# Patient Record
Sex: Female | Born: 1955 | Race: White | Hispanic: No | Smoking: Never smoker
Health system: Southern US, Community
[De-identification: ages and names within clinical notes are randomized; demographics above are authoritative.]

## PROBLEM LIST (undated history)

## (undated) DIAGNOSIS — I10 Essential (primary) hypertension: Secondary | ICD-10-CM

## (undated) DIAGNOSIS — M1990 Unspecified osteoarthritis, unspecified site: Secondary | ICD-10-CM

## (undated) DIAGNOSIS — I7300 Raynaud's syndrome without gangrene: Secondary | ICD-10-CM

## (undated) DIAGNOSIS — K219 Gastro-esophageal reflux disease without esophagitis: Secondary | ICD-10-CM

## (undated) DIAGNOSIS — E079 Disorder of thyroid, unspecified: Secondary | ICD-10-CM

## (undated) DIAGNOSIS — D649 Anemia, unspecified: Secondary | ICD-10-CM

## (undated) DIAGNOSIS — Z5189 Encounter for other specified aftercare: Secondary | ICD-10-CM

## (undated) HISTORY — DX: Raynaud's syndrome without gangrene: I73.00

## (undated) HISTORY — DX: Gastro-esophageal reflux disease without esophagitis: K21.9

## (undated) HISTORY — DX: Anemia, unspecified: D64.9

## (undated) HISTORY — DX: Unspecified osteoarthritis, unspecified site: M19.90

## (undated) HISTORY — PX: WISDOM TOOTH EXTRACTION: SHX21

## (undated) HISTORY — DX: Disorder of thyroid, unspecified: E07.9

## (undated) HISTORY — DX: Essential (primary) hypertension: I10

## (undated) HISTORY — PX: TONSILLECTOMY: SUR1361

## (undated) HISTORY — DX: Encounter for other specified aftercare: Z51.89

---

## 2013-07-10 ENCOUNTER — Encounter (HOSPITAL_COMMUNITY): Payer: Self-pay | Admitting: Emergency Medicine

## 2013-07-10 ENCOUNTER — Emergency Department (HOSPITAL_COMMUNITY)
Admission: EM | Admit: 2013-07-10 | Discharge: 2013-07-10 | Disposition: A | Discharge status: Home / Self Care | Payer: BC Managed Care – PPO | Source: Home / Self Care | Attending: Family Medicine | Admitting: Family Medicine

## 2013-07-10 ENCOUNTER — Emergency Department (INDEPENDENT_AMBULATORY_CARE_PROVIDER_SITE_OTHER): Payer: BC Managed Care – PPO

## 2013-07-10 DIAGNOSIS — J069 Acute upper respiratory infection, unspecified: Secondary | ICD-10-CM

## 2013-07-10 MED ORDER — HYDROCOD POLST-CHLORPHEN POLST 10-8 MG/5ML PO LQCR: 5.0000 mL | Freq: Two times a day (BID) | ORAL | Status: DC

## 2013-07-10 MED ORDER — AZITHROMYCIN 250 MG PO TABS: ORAL_TABLET | ORAL | Status: DC

## 2013-07-10 MED ORDER — IPRATROPIUM BROMIDE 0.06 % NA SOLN: 2.0000 | Freq: Four times a day (QID) | NASAL | Status: DC

## 2013-07-10 NOTE — ED Notes (Signed)
C/o pneumonia [facility] and was told her they hear rales and she need a chest xray. States her sx started out as a cold but lasted for 12 days. PCP in [place].  States she does have a cough

## 2013-07-10 NOTE — ED Provider Notes (Signed)
CSN: [id number]     Arrival date & time 07/10/13  1615 History   None    Chief Complaint  Patient presents with  . Pneumonia   (Consider location/radiation/quality/duration/timing/severity/associated sxs/prior Treatment) Patient is a 57 y.o. female presenting with pneumonia. The history is provided by the patient.  Pneumonia This is a new problem. The current episode started more than 1 week ago (cough and cold for 12 days, nonsmoker, no fever.). The problem has been gradually worsening. Pertinent negatives include no chest pain, no abdominal pain and no shortness of breath.    History reviewed. No pertinent past medical history. No past surgical history on file. No family history on file. History  Substance Use Topics  . Smoking status: Not on file  . Smokeless tobacco: Not on file  . Alcohol Use: Not on file   OB History   Grav Para Term Preterm Abortions TAB SAB Ect Mult Living                 Review of Systems  Constitutional: Negative.  Negative for fever.  HENT: Negative.   Respiratory: Positive for cough. Negative for chest tightness, shortness of breath and wheezing.   Cardiovascular: Negative.  Negative for chest pain.  Gastrointestinal: Negative for abdominal pain.    Allergies  Review of patient's allergies indicates no known allergies.  Home Medications   Current Outpatient Rx  Name  Route  Sig  Dispense  Refill  . azithromycin (ZITHROMAX Z-PAK) 250 MG tablet      Take as directed on pack   6 each   0   . chlorpheniramine-HYDROcodone (TUSSIONEX PENNKINETIC ER) 10-8 MG/5ML LQCR   Oral   Take 5 mLs by mouth every 12 (twelve) hours.   115 mL   0   . ipratropium (ATROVENT) 0.06 % nasal spray   Nasal   Place 2 sprays into the nose 4 (four) times daily.   15 mL   1    BP 143/71  Pulse 100  Temp(Src) 97.9 F (36.6 C) (Oral)  Resp 18  SpO2 100% Physical Exam  Nursing note and vitals reviewed. Constitutional: She is oriented to person, place,  and time. She appears well-developed and well-nourished.  HENT:  Right Ear: External ear normal.  Left Ear: External ear normal.  Nose: Nose normal.  Mouth/Throat: Oropharynx is clear and moist.  Eyes: Conjunctivae are normal. Pupils are equal, round, and reactive to light.  Neck: Normal range of motion. Neck supple.  Cardiovascular: Regular rhythm and normal heart sounds.   Pulmonary/Chest: Effort normal and breath sounds normal. She has no wheezes. She has no rales.  Abdominal: Soft. Bowel sounds are normal.  Neurological: She is alert and oriented to person, place, and time.  Skin: Skin is warm and dry.    ED Course  Procedures (including critical care time) Labs Review Labs Reviewed - No data to display Imaging Review Dg Chest 2 View  07/10/2013   CLINICAL DATA:  Cough  EXAM: CHEST  2 VIEW  COMPARISON:  None.  FINDINGS: The heart size and mediastinal contours are within normal limits. Both lungs are clear. The visualized skeletal structures are unremarkable.  IMPRESSION: No active cardiopulmonary disease.   Electronically Signed   By: Ruel Favors M.D.   On: 07/10/2013 18:28    EKG Interpretation     Ventricular Rate:    PR Interval:    QRS Duration:   QT Interval:    QTC Calculation:   R Axis:  Text Interpretation:              MDM  X-rays reviewed and report per radiologist.     Linna Hoff, MD 07/10/13 [phone]

## 2014-07-21 ENCOUNTER — Ambulatory Visit (INDEPENDENT_AMBULATORY_CARE_PROVIDER_SITE_OTHER): Payer: BC Managed Care – PPO | Admitting: Internal Medicine

## 2014-07-21 ENCOUNTER — Encounter: Payer: Self-pay | Admitting: Internal Medicine

## 2014-07-21 ENCOUNTER — Other Ambulatory Visit (INDEPENDENT_AMBULATORY_CARE_PROVIDER_SITE_OTHER): Payer: BC Managed Care – PPO

## 2014-07-21 VITALS — BP 118/88 | HR 90 | Temp 98.3°F | Resp 18 | Ht 66.0 in | Wt 191.8 lb

## 2014-07-21 DIAGNOSIS — E669 Obesity, unspecified: Secondary | ICD-10-CM

## 2014-07-21 DIAGNOSIS — Z0000 Encounter for general adult medical examination without abnormal findings: Secondary | ICD-10-CM

## 2014-07-21 DIAGNOSIS — R790 Abnormal level of blood mineral: Secondary | ICD-10-CM

## 2014-07-21 DIAGNOSIS — E039 Hypothyroidism, unspecified: Secondary | ICD-10-CM

## 2014-07-21 LAB — TSH: TSH: 0.89 u[IU]/mL (ref 0.35–4.50)

## 2014-07-21 LAB — LIPID PANEL
CHOL/HDL RATIO: 6
Cholesterol: 212 mg/dL — ABNORMAL HIGH (ref 0–200)
HDL: 34.6 mg/dL — ABNORMAL LOW (ref >39.00)
NonHDL: 177.4
VLDL: 82 mg/dL — ABNORMAL HIGH (ref 0.0–40.0)

## 2014-07-21 LAB — BASIC METABOLIC PANEL WITH GFR
BUN: 14 mg/dL (ref 6–23)
CHLORIDE: 102 meq/L (ref 96–112)
CO2: 25 meq/L (ref 19–32)
CREATININE: 0.91 mg/dL (ref 0.50–1.10)
Calcium: 9 mg/dL (ref 8.4–10.5)
GFR, EST NON AFRICAN AMERICAN: 71 mL/min
GFR, Est African American: 82 mL/min
Glucose, Bld: 90 mg/dL (ref 70–99)
POTASSIUM: 5 meq/L (ref 3.5–5.3)
Sodium: 137 mEq/L (ref 135–145)

## 2014-07-21 LAB — HEMOGLOBIN A1C: HEMOGLOBIN A1C: 5.6 % (ref 4.6–6.5)

## 2014-07-21 LAB — T4, FREE: Free T4: 0.87 ng/dL (ref 0.60–1.60)

## 2014-07-21 MED ORDER — LISINOPRIL 5 MG PO TABS: 5.0000 mg | ORAL_TABLET | Freq: Every day | ORAL | Status: DC

## 2014-07-21 NOTE — Progress Notes (Signed)
Pre visit review using our clinic review tool, if applicable. No additional management support is needed unless otherwise documented below in the visit note. 

## 2014-07-21 NOTE — Patient Instructions (Signed)
We will check your blood work today and call you with the results. We have sent in refills for your blood pressure medication and when your blood work is back we will send in refills of your thyroid medicine.  If things are going well we can see you back in one year. If you have any new problems or questions please feel free to call our office before then.  If you continue to struggle with the Zyrtec giving a dry mouth another alternative is to use a nasal steroid such as Flonase or Nasacort. These help with the sinus congestion without giving you symptoms such as dry mouth.  Health Maintenance Adopting a healthy lifestyle and getting preventive care can go a long way to promote health and wellness. Talk with your health care provider about what schedule of regular examinations is right for you. This is a good chance for you to check in with your provider about disease prevention and staying healthy. In between checkups, there are plenty of things you can do on your own. Experts have done a lot of research about which lifestyle changes and preventive measures are most likely to keep you healthy. Ask your health care provider for more information. WEIGHT AND DIET  Eat a healthy diet  Be sure to include plenty of vegetables, fruits, low-fat dairy products, and lean protein.  Do not eat a lot of foods high in solid fats, added sugars, or salt.  Get regular exercise. This is one of the most important things you can do for your health.  Most adults should exercise for at least 150 minutes each week. The exercise should increase your heart rate and make you sweat (moderate-intensity exercise).  Most adults should also do strengthening exercises at least twice a week. This is in addition to the moderate-intensity exercise.  Maintain a healthy weight  Body mass index (BMI) is a measurement that can be used to identify possible weight problems. It estimates body fat based on height and weight. Your  health care provider can help determine your BMI and help you achieve or maintain a healthy weight.  For females 30 years of age and older:   A BMI below 18.5 is considered underweight.  A BMI of 18.5 to 24.9 is normal.  A BMI of 25 to 29.9 is considered overweight.  A BMI of 30 and above is considered obese.  Watch levels of cholesterol and blood lipids  You should start having your blood tested for lipids and cholesterol at 57 years of age, then have this test every 5 years.  You may need to have your cholesterol levels checked more often if:  Your lipid or cholesterol levels are high.  You are older than 57 years of age.  You are at high risk for heart disease.  CANCER SCREENING   Lung Cancer  Lung cancer screening is recommended for adults 43-37 years old who are at high risk for lung cancer because of a history of smoking.  A yearly low-dose CT scan of the lungs is recommended for people who:  Currently smoke.  Have quit within the past 15 years.  Have at least a 30-pack-year history of smoking. A pack year is smoking an average of one pack of cigarettes a day for 1 year.  Yearly screening should continue until it has been 15 years since you quit.  Yearly screening should stop if you develop a health problem that would prevent you from having lung cancer treatment.  Breast Cancer  Practice breast self-awareness. This means understanding how your breasts normally appear and feel.  It also means doing regular breast self-exams. Let your health care provider know about any changes, no matter how small.  If you are in your 20s or 30s, you should have a clinical breast exam (CBE) by a health care provider every 1-3 years as part of a regular health exam.  If you are 33 or older, have a CBE every year. Also consider having a breast X-ray (mammogram) every year.  If you have a family history of breast cancer, talk to your health care provider about genetic  screening.  If you are at high risk for breast cancer, talk to your health care provider about having an MRI and a mammogram every year.  Breast cancer gene (BRCA) assessment is recommended for women who have family members with BRCA-related cancers. BRCA-related cancers include:  Breast.  Ovarian.  Tubal.  Peritoneal cancers.  Results of the assessment will determine the need for genetic counseling and BRCA1 and BRCA2 testing. Cervical Cancer Routine pelvic examinations to screen for cervical cancer are no longer recommended for nonpregnant women who are considered low risk for cancer of the pelvic organs (ovaries, uterus, and vagina) and who do not have symptoms. A pelvic examination may be necessary if you have symptoms including those associated with pelvic infections. Ask your health care provider if a screening pelvic exam is right for you.   The Pap test is the screening test for cervical cancer for women who are considered at risk.  If you had a hysterectomy for a problem that was not cancer or a condition that could lead to cancer, then you no longer need Pap tests.  If you are older than 65 years, and you have had normal Pap tests for the past 10 years, you no longer need to have Pap tests.  If you have had past treatment for cervical cancer or a condition that could lead to cancer, you need Pap tests and screening for cancer for at least 20 years after your treatment.  If you no longer get a Pap test, assess your risk factors if they change (such as having a new sexual partner). This can affect whether you should start being screened again.  Some women have medical problems that increase their chance of getting cervical cancer. If this is the case for you, your health care provider may recommend more frequent screening and Pap tests.  The human papillomavirus (HPV) test is another test that may be used for cervical cancer screening. The HPV test looks for the virus that can  cause cell changes in the cervix. The cells collected during the Pap test can be tested for HPV.  The HPV test can be used to screen women 30 years of age and older. Getting tested for HPV can extend the interval between normal Pap tests from three to five years.  An HPV test also should be used to screen women of any age who have unclear Pap test results.  After 57 years of age, women should have HPV testing as often as Pap tests.  Colorectal Cancer  This type of cancer can be detected and often prevented.  Routine colorectal cancer screening usually begins at 57 years of age and continues through 57 years of age.  Your health care provider may recommend screening at an earlier age if you have risk factors for colon cancer.  Your health care provider may also recommend using home test kits  to check for hidden blood in the stool.  A small camera at the end of a tube can be used to examine your colon directly (sigmoidoscopy or colonoscopy). This is done to check for the earliest forms of colorectal cancer.  Routine screening usually begins at age 18.  Direct examination of the colon should be repeated every 5-10 years through 57 years of age. However, you may need to be screened more often if early forms of precancerous polyps or small growths are found. Skin Cancer  Check your skin from head to toe regularly.  Tell your health care provider about any new moles or changes in moles, especially if there is a change in a mole's shape or color.  Also tell your health care provider if you have a mole that is larger than the size of a pencil eraser.  Always use sunscreen. Apply sunscreen liberally and repeatedly throughout the day.  Protect yourself by wearing long sleeves, pants, a wide-brimmed hat, and sunglasses whenever you are outside. HEART DISEASE, DIABETES, AND HIGH BLOOD PRESSURE   Have your blood pressure checked at least every 1-2 years. High blood pressure causes heart  disease and increases the risk of stroke.  If you are between 81 years and 21 years old, ask your health care provider if you should take aspirin to prevent strokes.  Have regular diabetes screenings. This involves taking a blood sample to check your fasting blood sugar level.  If you are at a normal weight and have a low risk for diabetes, have this test once every three years after 57 years of age.  If you are overweight and have a high risk for diabetes, consider being tested at a younger age or more often. PREVENTING INFECTION  Hepatitis B  If you have a higher risk for hepatitis B, you should be screened for this virus. You are considered at high risk for hepatitis B if:  You were born in a country where hepatitis B is common. Ask your health care provider which countries are considered high risk.  Your parents were born in a high-risk country, and you have not been immunized against hepatitis B (hepatitis B vaccine).  You have HIV or AIDS.  You use needles to inject street drugs.  You live with someone who has hepatitis B.  You have had sex with someone who has hepatitis B.  You get hemodialysis treatment.  You take certain medicines for conditions, including cancer, organ transplantation, and autoimmune conditions. Hepatitis C  Blood testing is recommended for:  Everyone born from [birth year] through 1964.  Anyone with known risk factors for hepatitis C. Sexually transmitted infections (STIs)  You should be screened for sexually transmitted infections (STIs) including gonorrhea and chlamydia if:  You are sexually active and are younger than 57 years of age.  You are older than 57 years of age and your health care provider tells you that you are at risk for this type of infection.  Your sexual activity has changed since you were last screened and you are at an increased risk for chlamydia or gonorrhea. Ask your health care provider if you are at risk.  If you do not have  HIV, but are at risk, it may be recommended that you take a prescription medicine daily to prevent HIV infection. This is called pre-exposure prophylaxis (PrEP). You are considered at risk if:  You are sexually active and do not regularly use condoms or know the HIV status of your partner(s).  You  take drugs by injection.  You are sexually active with a partner who has HIV. Talk with your health care provider about whether you are at high risk of being infected with HIV. If you choose to begin PrEP, you should first be tested for HIV. You should then be tested every 3 months for as long as you are taking PrEP.  PREGNANCY   If you are premenopausal and you may become pregnant, ask your health care provider about preconception counseling.  If you may become pregnant, take 400 to 800 micrograms (mcg) of folic acid every day.  If you want to prevent pregnancy, talk to your health care provider about birth control (contraception). OSTEOPOROSIS AND MENOPAUSE   Osteoporosis is a disease in which the bones lose minerals and strength with aging. This can result in serious bone fractures. Your risk for osteoporosis can be identified using a bone density scan.  If you are 15 years of age or older, or if you are at risk for osteoporosis and fractures, ask your health care provider if you should be screened.  Ask your health care provider whether you should take a calcium or vitamin D supplement to lower your risk for osteoporosis.  Menopause may have certain physical symptoms and risks.  Hormone replacement therapy may reduce some of these symptoms and risks. Talk to your health care provider about whether hormone replacement therapy is right for you.  HOME CARE INSTRUCTIONS   Schedule regular health, dental, and eye exams.  Stay current with your immunizations.   Do not use any tobacco products including cigarettes, chewing tobacco, or electronic cigarettes.  If you are pregnant, do not  drink alcohol.  If you are breastfeeding, limit how much and how often you drink alcohol.  Limit alcohol intake to no more than 1 drink per day for nonpregnant women. One drink equals 12 ounces of beer, 5 ounces of wine, or 1 ounces of hard liquor.  Do not use street drugs.  Do not share needles.  Ask your health care provider for help if you need support or information about quitting drugs.  Tell your health care provider if you often feel depressed.  Tell your health care provider if you have ever been abused or do not feel safe at home. Document Released: 03/07/2011 Document Revised: 01/06/2014 Document Reviewed: 07/24/2013 [facility] Patient Information 2015 [place], [place]. This information is not intended to replace advice given to you by your health care provider. Make sure you discuss any questions you have with your health care provider.

## 2014-07-21 NOTE — Progress Notes (Signed)
   Subjective:    Patient ID: Kerri BachAngela Plaia, female    DOB: 12/10/1955, 58 y.o.   MRN: [id number]  HPI The patient is a [age] show female who comes in today to self-care. She has past medical history of hypothyroidism, obesity, IBS. She states that since she's been on her lactate and her culture L she's done very well with her IBS and has not had too many problems with her digestive tract. She denies any weight loss, she feels that her Synthroid dose is appropriate. She states it has not been change in the last 10 years. She denies any joint pain, balance problems, recent falls. Denies any chest pains, shortness of breath, abdominal pain.  Review of Systems  Constitutional: Negative for fever, activity change, appetite change, fatigue and unexpected weight change.  HENT: Negative.   Respiratory: Negative for cough, chest tightness, shortness of breath and wheezing.   Cardiovascular: Negative for chest pain, palpitations and leg swelling.  Gastrointestinal: Negative for vomiting, abdominal pain, diarrhea, constipation and abdominal distention.  Endocrine: Negative.   Musculoskeletal: Negative.   Skin: Negative.   Neurological: Negative.   Psychiatric/Behavioral: Negative.       Objective:   Physical Exam  Constitutional: She is oriented to person, place, and time. She appears well-developed and well-nourished.  overweight  HENT:  Head: Normocephalic and atraumatic.  Eyes: EOM are normal.  Neck: Normal range of motion. No thyromegaly present.  Cardiovascular: Normal rate and regular rhythm.   Pulmonary/Chest: Effort normal and breath sounds normal. No respiratory distress. She has no wheezes. She has no rales.  Abdominal: Soft. Bowel sounds are normal. She exhibits no distension. There is no tenderness. There is no rebound.  Musculoskeletal: She exhibits no tenderness.  Neurological: She is alert and oriented to person, place, and time. Coordination normal.  Skin: Skin is warm and dry.     Filed Vitals:   07/21/14 1600  BP: 118/88  Pulse: 90  Temp: 98.3 F (36.8 C)  TempSrc: Oral  Resp: 18  Height: 5\' 6"  (1.676 m)  Weight: 191 lb 12.8 oz (87 kg)  SpO2: 93%      Assessment & Plan:

## 2014-07-22 DIAGNOSIS — E669 Obesity, unspecified: Secondary | ICD-10-CM | POA: Insufficient documentation

## 2014-07-22 DIAGNOSIS — Z0000 Encounter for general adult medical examination without abnormal findings: Secondary | ICD-10-CM | POA: Insufficient documentation

## 2014-07-22 LAB — LDL CHOLESTEROL, DIRECT: LDL DIRECT: 132.3 mg/dL

## 2014-07-22 NOTE — Assessment & Plan Note (Signed)
At this time currently no consultations. Spoke to the patient about losing a little bit away with diet and exercise. She will try however she is unsure she can add more exercise to her life at this time.

## 2014-07-22 NOTE — Assessment & Plan Note (Signed)
Patient states mammogram and colonoscopy done both this year. No pounds with mammogram in the past, no biopsies in the past. She states no problems with colonoscopy should be due in 10 years. She already had flu shot.

## 2014-07-22 NOTE — Assessment & Plan Note (Signed)
Continue Synthroid, check TSH for dosing adjustments.

## 2014-07-23 ENCOUNTER — Other Ambulatory Visit: Payer: Self-pay | Admitting: Geriatric Medicine

## 2014-07-23 ENCOUNTER — Telehealth: Payer: Self-pay | Admitting: Internal Medicine

## 2014-07-23 MED ORDER — LEVOTHYROXINE SODIUM 50 MCG PO TABS: 50.0000 ug | ORAL_TABLET | Freq: Every day | ORAL | Status: DC

## 2014-07-23 NOTE — Telephone Encounter (Signed)
Pt requests 90 day supply not 30 day supply if possible.

## 2014-07-24 MED ORDER — LEVOTHYROXINE SODIUM 50 MCG PO TABS: 50.0000 ug | ORAL_TABLET | Freq: Every day | ORAL | Status: DC

## 2015-06-26 ENCOUNTER — Encounter: Payer: Self-pay | Admitting: Internal Medicine

## 2015-06-26 ENCOUNTER — Other Ambulatory Visit (INDEPENDENT_AMBULATORY_CARE_PROVIDER_SITE_OTHER): Payer: Commercial Managed Care - PPO

## 2015-06-26 ENCOUNTER — Ambulatory Visit (INDEPENDENT_AMBULATORY_CARE_PROVIDER_SITE_OTHER): Payer: Commercial Managed Care - PPO | Admitting: Internal Medicine

## 2015-06-26 VITALS — BP 120/80 | HR 97 | Temp 97.8°F | Resp 16 | Ht 66.0 in | Wt 190.4 lb

## 2015-06-26 DIAGNOSIS — Z0000 Encounter for general adult medical examination without abnormal findings: Secondary | ICD-10-CM | POA: Diagnosis not present

## 2015-06-26 DIAGNOSIS — E669 Obesity, unspecified: Secondary | ICD-10-CM

## 2015-06-26 DIAGNOSIS — Z23 Encounter for immunization: Secondary | ICD-10-CM | POA: Diagnosis not present

## 2015-06-26 DIAGNOSIS — R7989 Other specified abnormal findings of blood chemistry: Secondary | ICD-10-CM | POA: Diagnosis not present

## 2015-06-26 DIAGNOSIS — E039 Hypothyroidism, unspecified: Secondary | ICD-10-CM

## 2015-06-26 LAB — COMPREHENSIVE METABOLIC PANEL
ALK PHOS: 73 U/L (ref 39–117)
ALT: 18 U/L (ref 0–35)
AST: 18 U/L (ref 0–37)
Albumin: 4.3 g/dL (ref 3.5–5.2)
BILIRUBIN TOTAL: 0.3 mg/dL (ref 0.2–1.2)
BUN: 18 mg/dL (ref 6–23)
CALCIUM: 10.1 mg/dL (ref 8.4–10.5)
CO2: 26 mEq/L (ref 19–32)
Chloride: 105 mEq/L (ref 96–112)
Creatinine, Ser: 1.03 mg/dL (ref 0.40–1.20)
GFR: 58.52 mL/min — AB (ref >60.00)
Glucose, Bld: 97 mg/dL (ref 70–99)
Potassium: 5.5 mEq/L — ABNORMAL HIGH (ref 3.5–5.1)
Sodium: 140 mEq/L (ref 135–145)
Total Protein: 7.7 g/dL (ref 6.0–8.3)

## 2015-06-26 LAB — LIPID PANEL
Cholesterol: 228 mg/dL — ABNORMAL HIGH (ref 0–200)
HDL: 36.3 mg/dL — AB (ref >39.00)
NONHDL: 191.59
Total CHOL/HDL Ratio: 6
Triglycerides: 348 mg/dL — ABNORMAL HIGH (ref 0.0–149.0)
VLDL: 69.6 mg/dL — ABNORMAL HIGH (ref 0.0–40.0)

## 2015-06-26 LAB — LDL CHOLESTEROL, DIRECT: Direct LDL: 141 mg/dL

## 2015-06-26 MED ORDER — BETAMETHASONE VALERATE 0.1 % EX OINT: 1.0000 "application " | TOPICAL_OINTMENT | Freq: Two times a day (BID) | CUTANEOUS | Status: DC

## 2015-06-26 NOTE — Assessment & Plan Note (Signed)
Checking TSH and free T4 and adjust as needed.  

## 2015-06-26 NOTE — Assessment & Plan Note (Signed)
Talked to her about increasing exercise and she is working on it. Weight down 1 pound since last year which is much better than increased.

## 2015-06-26 NOTE — Patient Instructions (Signed)
We will check your labs and have sent in your refills and given you the flu shot.   Health Maintenance, Female Adopting a healthy lifestyle and getting preventive care can go a long way to promote health and wellness. Talk with your health care provider about what schedule of regular examinations is right for you. This is a good chance for you to check in with your provider about disease prevention and staying healthy. In between checkups, there are plenty of things you can do on your own. Experts have done a lot of research about which lifestyle changes and preventive measures are most likely to keep you healthy. Ask your health care provider for more information. WEIGHT AND DIET  Eat a healthy diet  Be sure to include plenty of vegetables, fruits, low-fat dairy products, and lean protein.  Do not eat a lot of foods high in solid fats, added sugars, or salt.  Get regular exercise. This is one of the most important things you can do for your health.  Most adults should exercise for at least 150 minutes each week. The exercise should increase your heart rate and make you sweat (moderate-intensity exercise).  Most adults should also do strengthening exercises at least twice a week. This is in addition to the moderate-intensity exercise.  Maintain a healthy weight  Body mass index (BMI) is a measurement that can be used to identify possible weight problems. It estimates body fat based on height and weight. Your health care provider can help determine your BMI and help you achieve or maintain a healthy weight.  For females 6 years of age and older:   A BMI below 18.5 is considered underweight.  A BMI of 18.5 to 24.9 is normal.  A BMI of 25 to 29.9 is considered overweight.  A BMI of 30 and above is considered obese.  Watch levels of cholesterol and blood lipids  You should start having your blood tested for lipids and cholesterol at 58 years of age, then have this test every 5  years.  You may need to have your cholesterol levels checked more often if:  Your lipid or cholesterol levels are high.  You are older than 58 years of age.  You are at high risk for heart disease.  CANCER SCREENING   Lung Cancer  Lung cancer screening is recommended for adults 30-83 years old who are at high risk for lung cancer because of a history of smoking.  A yearly low-dose CT scan of the lungs is recommended for people who:  Currently smoke.  Have quit within the past 15 years.  Have at least a 30-pack-year history of smoking. A pack year is smoking an average of one pack of cigarettes a day for 1 year.  Yearly screening should continue until it has been 15 years since you quit.  Yearly screening should stop if you develop a health problem that would prevent you from having lung cancer treatment.  Breast Cancer  Practice breast self-awareness. This means understanding how your breasts normally appear and feel.  It also means doing regular breast self-exams. Let your health care provider know about any changes, no matter how small.  If you are in your 20s or 30s, you should have a clinical breast exam (CBE) by a health care provider every 1-3 years as part of a regular health exam.  If you are 51 or older, have a CBE every year. Also consider having a breast X-ray (mammogram) every year.  If you  have a family history of breast cancer, talk to your health care provider about genetic screening.  If you are at high risk for breast cancer, talk to your health care provider about having an MRI and a mammogram every year.  Breast cancer gene (BRCA) assessment is recommended for women who have family members with BRCA-related cancers. BRCA-related cancers include:  Breast.  Ovarian.  Tubal.  Peritoneal cancers.  Results of the assessment will determine the need for genetic counseling and BRCA1 and BRCA2 testing. Cervical Cancer Your health care provider may  recommend that you be screened regularly for cancer of the pelvic organs (ovaries, uterus, and vagina). This screening involves a pelvic examination, including checking for microscopic changes to the surface of your cervix (Pap test). You may be encouraged to have this screening done every 3 years, beginning at age 50.  For women ages 72-65, health care providers may recommend pelvic exams and Pap testing every 3 years, or they may recommend the Pap and pelvic exam, combined with testing for human papilloma virus (HPV), every 5 years. Some types of HPV increase your risk of cervical cancer. Testing for HPV may also be done on women of any age with unclear Pap test results.  Other health care providers may not recommend any screening for nonpregnant women who are considered low risk for pelvic cancer and who do not have symptoms. Ask your health care provider if a screening pelvic exam is right for you.  If you have had past treatment for cervical cancer or a condition that could lead to cancer, you need Pap tests and screening for cancer for at least 20 years after your treatment. If Pap tests have been discontinued, your risk factors (such as having a new sexual partner) need to be reassessed to determine if screening should resume. Some women have medical problems that increase the chance of getting cervical cancer. In these cases, your health care provider may recommend more frequent screening and Pap tests. Colorectal Cancer  This type of cancer can be detected and often prevented.  Routine colorectal cancer screening usually begins at 58 years of age and continues through 58 years of age.  Your health care provider may recommend screening at an earlier age if you have risk factors for colon cancer.  Your health care provider may also recommend using home test kits to check for hidden blood in the stool.  A small camera at the end of a tube can be used to examine your colon directly  (sigmoidoscopy or colonoscopy). This is done to check for the earliest forms of colorectal cancer.  Routine screening usually begins at age 43.  Direct examination of the colon should be repeated every 5-10 years through 58 years of age. However, you may need to be screened more often if early forms of precancerous polyps or small growths are found. Skin Cancer  Check your skin from head to toe regularly.  Tell your health care provider about any new moles or changes in moles, especially if there is a change in a mole's shape or color.  Also tell your health care provider if you have a mole that is larger than the size of a pencil eraser.  Always use sunscreen. Apply sunscreen liberally and repeatedly throughout the day.  Protect yourself by wearing long sleeves, pants, a wide-brimmed hat, and sunglasses whenever you are outside. HEART DISEASE, DIABETES, AND HIGH BLOOD PRESSURE   High blood pressure causes heart disease and increases the risk of stroke.  High blood pressure is more likely to develop in:  People who have blood pressure in the high end of the normal range (130-139/85-89 mm Hg).  People who are overweight or obese.  People who are African American.  If you are 42-104 years of age, have your blood pressure checked every 3-5 years. If you are 60 years of age or older, have your blood pressure checked every year. You should have your blood pressure measured twice--once when you are at a hospital or clinic, and once when you are not at a hospital or clinic. Record the average of the two measurements. To check your blood pressure when you are not at a hospital or clinic, you can use:  An automated blood pressure machine at a pharmacy.  A home blood pressure monitor.  If you are between 18 years and 59 years old, ask your health care provider if you should take aspirin to prevent strokes.  Have regular diabetes screenings. This involves taking a blood sample to check your  fasting blood sugar level.  If you are at a normal weight and have a low risk for diabetes, have this test once every three years after 58 years of age.  If you are overweight and have a high risk for diabetes, consider being tested at a younger age or more often. PREVENTING INFECTION  Hepatitis B  If you have a higher risk for hepatitis B, you should be screened for this virus. You are considered at high risk for hepatitis B if:  You were born in a country where hepatitis B is common. Ask your health care provider which countries are considered high risk.  Your parents were born in a high-risk country, and you have not been immunized against hepatitis B (hepatitis B vaccine).  You have HIV or AIDS.  You use needles to inject street drugs.  You live with someone who has hepatitis B.  You have had sex with someone who has hepatitis B.  You get hemodialysis treatment.  You take certain medicines for conditions, including cancer, organ transplantation, and autoimmune conditions. Hepatitis C  Blood testing is recommended for:  Everyone born from [birth year] through 1964.  Anyone with known risk factors for hepatitis C. Sexually transmitted infections (STIs)  You should be screened for sexually transmitted infections (STIs) including gonorrhea and chlamydia if:  You are sexually active and are younger than 59 years of age.  You are older than 58 years of age and your health care provider tells you that you are at risk for this type of infection.  Your sexual activity has changed since you were last screened and you are at an increased risk for chlamydia or gonorrhea. Ask your health care provider if you are at risk.  If you do not have HIV, but are at risk, it may be recommended that you take a prescription medicine daily to prevent HIV infection. This is called pre-exposure prophylaxis (PrEP). You are considered at risk if:  You are sexually active and do not regularly use condoms or  know the HIV status of your partner(s).  You take drugs by injection.  You are sexually active with a partner who has HIV. Talk with your health care provider about whether you are at high risk of being infected with HIV. If you choose to begin PrEP, you should first be tested for HIV. You should then be tested every 3 months for as long as you are taking PrEP.  PREGNANCY   If you  are premenopausal and you may become pregnant, ask your health care provider about preconception counseling.  If you may become pregnant, take 400 to 800 micrograms (mcg) of folic acid every day.  If you want to prevent pregnancy, talk to your health care provider about birth control (contraception). OSTEOPOROSIS AND MENOPAUSE   Osteoporosis is a disease in which the bones lose minerals and strength with aging. This can result in serious bone fractures. Your risk for osteoporosis can be identified using a bone density scan.  If you are 38 years of age or older, or if you are at risk for osteoporosis and fractures, ask your health care provider if you should be screened.  Ask your health care provider whether you should take a calcium or vitamin D supplement to lower your risk for osteoporosis.  Menopause may have certain physical symptoms and risks.  Hormone replacement therapy may reduce some of these symptoms and risks. Talk to your health care provider about whether hormone replacement therapy is right for you.  HOME CARE INSTRUCTIONS   Schedule regular health, dental, and eye exams.  Stay current with your immunizations.   Do not use any tobacco products including cigarettes, chewing tobacco, or electronic cigarettes.  If you are pregnant, do not drink alcohol.  If you are breastfeeding, limit how much and how often you drink alcohol.  Limit alcohol intake to no more than 1 drink per day for nonpregnant women. One drink equals 12 ounces of beer, 5 ounces of wine, or 1 ounces of hard liquor.  Do  not use street drugs.  Do not share needles.  Ask your health care provider for help if you need support or information about quitting drugs.  Tell your health care provider if you often feel depressed.  Tell your health care provider if you have ever been abused or do not feel safe at home.   This information is not intended to replace advice given to you by your health care provider. Make sure you discuss any questions you have with your health care provider.   Document Released: 03/07/2011 Document Revised: 09/12/2014 Document Reviewed: 07/24/2013 Elsevier Interactive Patient Education [employer].

## 2015-06-26 NOTE — Progress Notes (Signed)
Pre visit review using our clinic review tool, if applicable. No additional management support is needed unless otherwise documented below in the visit note. 

## 2015-06-26 NOTE — Progress Notes (Signed)
   Subjective:    Patient ID: Christin BachAngela Delmore, female    DOB: 11/12/1955, 59 y.o.   MRN: [id number]  HPI The patient is a 59 YO female coming in for wellness. No new concerns.   [facility], [facility], social history reviewed and updated.   Review of Systems  Constitutional: Negative for fever, activity change, appetite change, fatigue and unexpected weight change.  HENT: Negative.   Eyes: Negative.   Respiratory: Negative for cough, chest tightness, shortness of breath and wheezing.   Cardiovascular: Negative for chest pain, palpitations and leg swelling.  Gastrointestinal: Negative for vomiting, abdominal pain, diarrhea, constipation and abdominal distention.  Endocrine: Negative.   Musculoskeletal: Negative.   Skin: Negative.   Neurological: Negative.   Psychiatric/Behavioral: Negative.       Objective:   Physical Exam  Constitutional: She is oriented to person, place, and time. She appears well-developed and well-nourished.  overweight  HENT:  Head: Normocephalic and atraumatic.  Eyes: EOM are normal.  Neck: Normal range of motion. No thyromegaly present.  Cardiovascular: Normal rate and regular rhythm.   Pulmonary/Chest: Effort normal and breath sounds normal. No respiratory distress. She has no wheezes. She has no rales.  Abdominal: Soft. Bowel sounds are normal. She exhibits no distension. There is no tenderness. There is no rebound.  Musculoskeletal: She exhibits no tenderness.  Neurological: She is alert and oriented to person, place, and time. Coordination normal.  Skin: Skin is warm and dry.   Filed Vitals:   06/26/15 1612  BP: 120/80  Pulse: 97  Temp: 97.8 F (36.6 C)  TempSrc: Oral  Resp: 16  Height: 5\' 6"  (1.676 m)  Weight: 190 lb 6.4 oz (86.365 kg)  SpO2: 98%      Assessment & Plan:  Flu shot given at visit.

## 2015-06-26 NOTE — Assessment & Plan Note (Signed)
Mammogram and colonoscopy up to date. Flu shot given today, checking labs. Talked to her about skin cancer prevention and sun safety.

## 2015-06-27 LAB — T4: T4, Total: 9.5 ug/dL (ref 4.5–12.0)

## 2015-06-29 LAB — TSH: TSH: 0.94 u[IU]/mL (ref 0.35–4.50)

## 2015-07-06 ENCOUNTER — Other Ambulatory Visit: Payer: Self-pay | Admitting: Internal Medicine

## 2015-07-06 DIAGNOSIS — E875 Hyperkalemia: Secondary | ICD-10-CM

## 2015-07-07 ENCOUNTER — Other Ambulatory Visit (INDEPENDENT_AMBULATORY_CARE_PROVIDER_SITE_OTHER): Payer: Commercial Managed Care - PPO

## 2015-07-07 DIAGNOSIS — E875 Hyperkalemia: Secondary | ICD-10-CM

## 2015-07-07 LAB — BASIC METABOLIC PANEL
BUN: 14 mg/dL (ref 6–23)
CO2: 27 mEq/L (ref 19–32)
CREATININE: 1 mg/dL (ref 0.40–1.20)
Calcium: 9.7 mg/dL (ref 8.4–10.5)
Chloride: 105 mEq/L (ref 96–112)
GFR: 60.55 mL/min (ref >60.00)
Glucose, Bld: 99 mg/dL (ref 70–99)
POTASSIUM: 4.5 meq/L (ref 3.5–5.1)
Sodium: 138 mEq/L (ref 135–145)

## 2015-07-16 ENCOUNTER — Telehealth: Payer: Self-pay | Admitting: Internal Medicine

## 2015-07-17 ENCOUNTER — Other Ambulatory Visit: Payer: Self-pay | Admitting: Geriatric Medicine

## 2015-07-17 MED ORDER — LEVOTHYROXINE SODIUM 50 MCG PO TABS: ORAL_TABLET | ORAL | Status: DC

## 2015-07-17 MED ORDER — LISINOPRIL 5 MG PO TABS: 5.0000 mg | ORAL_TABLET | Freq: Every day | ORAL | Status: DC

## 2015-07-17 NOTE — Telephone Encounter (Signed)
[facility]

## 2015-07-17 NOTE — Telephone Encounter (Signed)
I see these were only for a month. She was just seen so can she have a years worth prescriptions

## 2015-07-17 NOTE — Telephone Encounter (Signed)
Pt called regarding these refills and she needs the prescriptions filled for a years worth

## 2015-11-17 ENCOUNTER — Ambulatory Visit: Payer: Commercial Managed Care - PPO | Admitting: Internal Medicine

## 2016-07-20 ENCOUNTER — Ambulatory Visit (INDEPENDENT_AMBULATORY_CARE_PROVIDER_SITE_OTHER): Payer: BLUE CROSS/BLUE SHIELD | Admitting: Internal Medicine

## 2016-07-20 ENCOUNTER — Encounter: Payer: Self-pay | Admitting: Internal Medicine

## 2016-07-20 ENCOUNTER — Other Ambulatory Visit (INDEPENDENT_AMBULATORY_CARE_PROVIDER_SITE_OTHER): Payer: BLUE CROSS/BLUE SHIELD

## 2016-07-20 VITALS — BP 130/82 | HR 86 | Temp 98.4°F | Resp 14 | Ht 66.0 in | Wt 194.0 lb

## 2016-07-20 DIAGNOSIS — Z0000 Encounter for general adult medical examination without abnormal findings: Secondary | ICD-10-CM | POA: Diagnosis not present

## 2016-07-20 DIAGNOSIS — Z23 Encounter for immunization: Secondary | ICD-10-CM | POA: Diagnosis not present

## 2016-07-20 DIAGNOSIS — R7989 Other specified abnormal findings of blood chemistry: Secondary | ICD-10-CM

## 2016-07-20 LAB — LIPID PANEL
CHOL/HDL RATIO: 4
Cholesterol: 214 mg/dL — ABNORMAL HIGH (ref 0–200)
HDL: 50.6 mg/dL (ref >39.00)
NonHDL: 163.17
Triglycerides: 217 mg/dL — ABNORMAL HIGH (ref 0.0–149.0)
VLDL: 43.4 mg/dL — AB (ref 0.0–40.0)

## 2016-07-20 LAB — CBC
HEMATOCRIT: 39 % (ref 36.0–46.0)
HEMOGLOBIN: 13.4 g/dL (ref 12.0–15.0)
MCHC: 34.5 g/dL (ref 30.0–36.0)
MCV: 91.2 fl (ref 78.0–100.0)
Platelets: 316 10*3/uL (ref 150.0–400.0)
RBC: 4.28 Mil/uL (ref 3.87–5.11)
RDW: 13.4 % (ref 11.5–15.5)
WBC: 8.6 10*3/uL (ref 4.0–10.5)

## 2016-07-20 LAB — COMPREHENSIVE METABOLIC PANEL
ALK PHOS: 71 U/L (ref 39–117)
ALT: 20 U/L (ref 0–35)
AST: 20 U/L (ref 0–37)
Albumin: 4.1 g/dL (ref 3.5–5.2)
BUN: 10 mg/dL (ref 6–23)
CHLORIDE: 105 meq/L (ref 96–112)
CO2: 26 mEq/L (ref 19–32)
Calcium: 9.8 mg/dL (ref 8.4–10.5)
Creatinine, Ser: 0.87 mg/dL (ref 0.40–1.20)
GFR: 70.85 mL/min (ref >60.00)
GLUCOSE: 92 mg/dL (ref 70–99)
POTASSIUM: 4.4 meq/L (ref 3.5–5.1)
SODIUM: 139 meq/L (ref 135–145)
TOTAL PROTEIN: 7.3 g/dL (ref 6.0–8.3)
Total Bilirubin: 0.5 mg/dL (ref 0.2–1.2)

## 2016-07-20 LAB — LDL CHOLESTEROL, DIRECT: LDL DIRECT: 143 mg/dL

## 2016-07-20 LAB — T4, FREE: Free T4: 0.62 ng/dL (ref 0.60–1.60)

## 2016-07-20 LAB — TSH: TSH: 3.87 u[IU]/mL (ref 0.35–4.50)

## 2016-07-20 MED ORDER — CLINDAMYCIN PHOSPHATE 1 % EX LOTN: TOPICAL_LOTION | Freq: Two times a day (BID) | CUTANEOUS | 5 refills | Status: DC

## 2016-07-20 MED ORDER — BETAMETHASONE VALERATE 0.1 % EX CREA: TOPICAL_CREAM | Freq: Two times a day (BID) | CUTANEOUS | 0 refills | Status: DC

## 2016-07-20 NOTE — Assessment & Plan Note (Signed)
Counseled about weight loss. Ordered mammogram. Checking labs today. Counseled about dangers of distracted driving. Given screening recommendations. Given flu shot at visit, tetanus up to date.

## 2016-07-20 NOTE — Assessment & Plan Note (Signed)
Checking TSH and free T4 and if levels are low may need to resume levothyroxine.

## 2016-07-20 NOTE — Progress Notes (Signed)
Pre visit review using our clinic review tool, if applicable. No additional management support is needed unless otherwise documented below in the visit note. 

## 2016-07-20 NOTE — Progress Notes (Signed)
   Subjective:    Patient ID: Kerri Allen, female    DOB: 12/16/1955, 60 y.o.   MRN: [id number]  HPI The patient is a 60 YO female coming in for wellness. She has stopped taking lisinopril and thyroid medication about 1 month ago to see if she still needs them. She is working on losing weight.   [facility], [facility], social history reviewed and updated.   Review of Systems  Constitutional: Negative for activity change, appetite change, fatigue, fever and unexpected weight change.  HENT: Negative.   Eyes: Negative.   Respiratory: Negative for cough, chest tightness, shortness of breath and wheezing.   Cardiovascular: Negative.   Gastrointestinal: Negative.   Musculoskeletal: Negative.   Skin: Negative.   Neurological: Negative.   Psychiatric/Behavioral: Negative.       Objective:   Physical Exam  Constitutional: She is oriented to person, place, and time. She appears well-developed and well-nourished.  HENT:  Head: Normocephalic and atraumatic.  Eyes: EOM are normal.  Neck: Normal range of motion.  Cardiovascular: Normal rate and regular rhythm.   Pulmonary/Chest: Effort normal and breath sounds normal. No respiratory distress. She has no wheezes. She has no rales.  Abdominal: Soft. Bowel sounds are normal. She exhibits no distension. There is no tenderness. There is no rebound.  Musculoskeletal: She exhibits no edema.  Neurological: She is alert and oriented to person, place, and time. Coordination normal.  Skin: Skin is warm and dry.  Psychiatric: She has a normal mood and affect.   Vitals:   07/20/16 0805  BP: 130/82  Pulse: 86  Resp: 14  Temp: 98.4 F (36.9 C)  TempSrc: Oral  SpO2: 99%  Weight: 194 lb (88 kg)  Height: 5\' 6"  (1.676 m)      Assessment & Plan:  Flu shot given at visit.

## 2016-07-20 NOTE — Assessment & Plan Note (Signed)
Weight up slightly from last year and she is working on this.

## 2016-07-20 NOTE — Patient Instructions (Signed)
We are checking the labs today and will send them on mychart. We have given you the flu shot today.  Consider reading the book [employer] Mila MerrySusan Peirce Thompson to see if it can help.

## 2016-07-21 LAB — HEPATITIS C ANTIBODY: HCV Ab: NEGATIVE

## 2016-08-05 ENCOUNTER — Other Ambulatory Visit: Payer: Self-pay | Admitting: Internal Medicine

## 2016-08-05 DIAGNOSIS — Z1231 Encounter for screening mammogram for malignant neoplasm of breast: Secondary | ICD-10-CM

## 2016-08-09 ENCOUNTER — Ambulatory Visit
Admission: RE | Admit: 2016-08-09 | Discharge: 2016-08-09 | Disposition: A | Discharge status: Home / Self Care | Payer: BLUE CROSS/BLUE SHIELD | Source: Ambulatory Visit | Attending: Internal Medicine | Admitting: Internal Medicine

## 2016-08-09 DIAGNOSIS — Z1231 Encounter for screening mammogram for malignant neoplasm of breast: Secondary | ICD-10-CM

## 2016-10-04 ENCOUNTER — Telehealth: Payer: Self-pay | Admitting: *Deleted

## 2016-10-04 MED ORDER — LISINOPRIL 5 MG PO TABS: 5.0000 mg | ORAL_TABLET | Freq: Every day | ORAL | 3 refills | Status: DC

## 2016-10-04 NOTE — Telephone Encounter (Signed)
Rec call pt states at her cpx back in Nov MD d/c her Lisinopril. She has been monitoring her BP and its been creeping back up running in the rage of 140-145/90's. Requesting to go back on the lisinopril same dosage...Kerri Allen/lmb

## 2016-10-04 NOTE — Telephone Encounter (Signed)
Sent in the lisinopril.

## 2016-10-04 NOTE — Telephone Encounter (Signed)
Notified pt MD sent rx to [facility]...Raechel Chute/lmb

## 2017-07-21 ENCOUNTER — Ambulatory Visit (INDEPENDENT_AMBULATORY_CARE_PROVIDER_SITE_OTHER): Payer: BLUE CROSS/BLUE SHIELD | Admitting: Internal Medicine

## 2017-07-21 ENCOUNTER — Other Ambulatory Visit (INDEPENDENT_AMBULATORY_CARE_PROVIDER_SITE_OTHER): Payer: BLUE CROSS/BLUE SHIELD

## 2017-07-21 ENCOUNTER — Encounter: Payer: Self-pay | Admitting: Internal Medicine

## 2017-07-21 VITALS — BP 108/78 | HR 76 | Temp 97.7°F | Ht 66.0 in | Wt 159.0 lb

## 2017-07-21 DIAGNOSIS — Z0000 Encounter for general adult medical examination without abnormal findings: Secondary | ICD-10-CM

## 2017-07-21 DIAGNOSIS — Z23 Encounter for immunization: Secondary | ICD-10-CM

## 2017-07-21 LAB — LIPID PANEL
CHOLESTEROL: 219 mg/dL — AB (ref 0–200)
HDL: 44.6 mg/dL (ref >39.00)
NonHDL: 174.27
Total CHOL/HDL Ratio: 5
Triglycerides: 221 mg/dL — ABNORMAL HIGH (ref 0.0–149.0)
VLDL: 44.2 mg/dL — ABNORMAL HIGH (ref 0.0–40.0)

## 2017-07-21 LAB — COMPREHENSIVE METABOLIC PANEL
ALBUMIN: 4 g/dL (ref 3.5–5.2)
ALT: 13 U/L (ref 0–35)
AST: 16 U/L (ref 0–37)
Alkaline Phosphatase: 72 U/L (ref 39–117)
BUN: 15 mg/dL (ref 6–23)
CHLORIDE: 106 meq/L (ref 96–112)
CO2: 25 mEq/L (ref 19–32)
CREATININE: 0.96 mg/dL (ref 0.40–1.20)
Calcium: 9.9 mg/dL (ref 8.4–10.5)
GFR: 63.02 mL/min (ref >60.00)
GLUCOSE: 95 mg/dL (ref 70–99)
Potassium: 5.3 mEq/L — ABNORMAL HIGH (ref 3.5–5.1)
SODIUM: 138 meq/L (ref 135–145)
TOTAL PROTEIN: 7.1 g/dL (ref 6.0–8.3)
Total Bilirubin: 0.4 mg/dL (ref 0.2–1.2)

## 2017-07-21 LAB — CBC
HCT: 40.7 % (ref 36.0–46.0)
Hemoglobin: 13.8 g/dL (ref 12.0–15.0)
MCHC: 34 g/dL (ref 30.0–36.0)
MCV: 95.1 fl (ref 78.0–100.0)
Platelets: 296 10*3/uL (ref 150.0–400.0)
RBC: 4.28 Mil/uL (ref 3.87–5.11)
RDW: 12.4 % (ref 11.5–15.5)
WBC: 8.4 10*3/uL (ref 4.0–10.5)

## 2017-07-21 LAB — TSH: TSH: 2.29 u[IU]/mL (ref 0.35–4.50)

## 2017-07-21 LAB — T4, FREE: FREE T4: 0.69 ng/dL (ref 0.60–1.60)

## 2017-07-21 LAB — LDL CHOLESTEROL, DIRECT: LDL DIRECT: 138 mg/dL

## 2017-07-21 LAB — VITAMIN D 25 HYDROXY (VIT D DEFICIENCY, FRACTURES): VITD: 36.45 ng/mL (ref 30.00–100.00)

## 2017-07-21 NOTE — Progress Notes (Signed)
   Subjective:    Patient ID: Christin BachAngela Lehmkuhl, female    DOB: 02/22/1956, 61 y.o.   MRN: [id number]  HPI The patient is a 61 YO female coming in for physical.  [facility], [facility], social history reviewed and updated.  Review of Systems  Constitutional: Negative.   HENT: Negative.   Eyes: Negative.   Respiratory: Negative for cough, chest tightness and shortness of breath.   Cardiovascular: Negative for chest pain, palpitations and leg swelling.  Gastrointestinal: Negative for abdominal distention, abdominal pain, constipation, diarrhea, nausea and vomiting.  Musculoskeletal: Negative.   Skin: Negative.   Neurological: Negative.   Psychiatric/Behavioral: Negative.       Objective:   Physical Exam  Constitutional: She is oriented to person, place, and time. She appears well-developed and well-nourished.  HENT:  Head: Normocephalic and atraumatic.  Eyes: EOM are normal.  Neck: Normal range of motion.  Cardiovascular: Normal rate and regular rhythm.  Pulmonary/Chest: Effort normal and breath sounds normal. No respiratory distress. She has no wheezes. She has no rales.  Abdominal: Soft. Bowel sounds are normal. She exhibits no distension. There is no tenderness. There is no rebound.  Musculoskeletal: She exhibits no edema.  Neurological: She is alert and oriented to person, place, and time. Coordination normal.  Skin: Skin is warm and dry.  Psychiatric: She has a normal mood and affect.   Vitals:   07/21/17 0803  BP: 108/78  Pulse: 76  Temp: 97.7 F (36.5 C)  TempSrc: Oral  SpO2: 100%  Weight: 159 lb (72.1 kg)  Height: 5\' 6"  (1.676 m)      Assessment & Plan:  Flu shot given at visit

## 2017-07-21 NOTE — Assessment & Plan Note (Signed)
Stop lisinopril due to weight loss and normal BP. Flu shot given at visit. Tetanus up to date. Placed on shingrix waiting list. Colonoscopy up to date. Mammogram and pap smear up to date. Given screening recommendations. Counseled about sun safety and mole surveillance.

## 2017-07-21 NOTE — Patient Instructions (Signed)
We have given you the flu shot and will put you on the waiting list for shingles vaccine.   Health Maintenance, Female Adopting a healthy lifestyle and getting preventive care can go a long way to promote health and wellness. Talk with your health care provider about what schedule of regular examinations is right for you. This is a good chance for you to check in with your provider about disease prevention and staying healthy. In between checkups, there are plenty of things you can do on your own. Experts have done a lot of research about which lifestyle changes and preventive measures are most likely to keep you healthy. Ask your health care provider for more information. Weight and diet Eat a healthy diet  Be sure to include plenty of vegetables, fruits, low-fat dairy products, and lean protein.  Do not eat a lot of foods high in solid fats, added sugars, or salt.  Get regular exercise. This is one of the most important things you can do for your health. ? Most adults should exercise for at least 150 minutes each week. The exercise should increase your heart rate and make you sweat (moderate-intensity exercise). ? Most adults should also do strengthening exercises at least twice a week. This is in addition to the moderate-intensity exercise.  Maintain a healthy weight  Body mass index (BMI) is a measurement that can be used to identify possible weight problems. It estimates body fat based on height and weight. Your health care provider can help determine your BMI and help you achieve or maintain a healthy weight.  For females 59 years of age and older: ? A BMI below 18.5 is considered underweight. ? A BMI of 18.5 to 24.9 is normal. ? A BMI of 25 to 29.9 is considered overweight. ? A BMI of 30 and above is considered obese.  Watch levels of cholesterol and blood lipids  You should start having your blood tested for lipids and cholesterol at 60 years of age, then have this test every 5  years.  You may need to have your cholesterol levels checked more often if: ? Your lipid or cholesterol levels are high. ? You are older than 60 years of age. ? You are at high risk for heart disease.  Cancer screening Lung Cancer  Lung cancer screening is recommended for adults 68-39 years old who are at high risk for lung cancer because of a history of smoking.  A yearly low-dose CT scan of the lungs is recommended for people who: ? Currently smoke. ? Have quit within the past 15 years. ? Have at least a 30-pack-year history of smoking. A pack year is smoking an average of one pack of cigarettes a day for 1 year.  Yearly screening should continue until it has been 15 years since you quit.  Yearly screening should stop if you develop a health problem that would prevent you from having lung cancer treatment.  Breast Cancer  Practice breast self-awareness. This means understanding how your breasts normally appear and feel.  It also means doing regular breast self-exams. Let your health care provider know about any changes, no matter how small.  If you are in your 20s or 30s, you should have a clinical breast exam (CBE) by a health care provider every 1-3 years as part of a regular health exam.  If you are 63 or older, have a CBE every year. Also consider having a breast X-ray (mammogram) every year.  If you have a family  history of breast cancer, talk to your health care provider about genetic screening.  If you are at high risk for breast cancer, talk to your health care provider about having an MRI and a mammogram every year.  Breast cancer gene (BRCA) assessment is recommended for women who have family members with BRCA-related cancers. BRCA-related cancers include: ? Breast. ? Ovarian. ? Tubal. ? Peritoneal cancers.  Results of the assessment will determine the need for genetic counseling and BRCA1 and BRCA2 testing.  Cervical Cancer Your health care provider may  recommend that you be screened regularly for cancer of the pelvic organs (ovaries, uterus, and vagina). This screening involves a pelvic examination, including checking for microscopic changes to the surface of your cervix (Pap test). You may be encouraged to have this screening done every 3 years, beginning at age 83.  For women ages 16-65, health care providers may recommend pelvic exams and Pap testing every 3 years, or they may recommend the Pap and pelvic exam, combined with testing for human papilloma virus (HPV), every 5 years. Some types of HPV increase your risk of cervical cancer. Testing for HPV may also be done on women of any age with unclear Pap test results.  Other health care providers may not recommend any screening for nonpregnant women who are considered low risk for pelvic cancer and who do not have symptoms. Ask your health care provider if a screening pelvic exam is right for you.  If you have had past treatment for cervical cancer or a condition that could lead to cancer, you need Pap tests and screening for cancer for at least 20 years after your treatment. If Pap tests have been discontinued, your risk factors (such as having a new sexual partner) need to be reassessed to determine if screening should resume. Some women have medical problems that increase the chance of getting cervical cancer. In these cases, your health care provider may recommend more frequent screening and Pap tests.  Colorectal Cancer  This type of cancer can be detected and often prevented.  Routine colorectal cancer screening usually begins at 60 years of age and continues through 61 years of age.  Your health care provider may recommend screening at an earlier age if you have risk factors for colon cancer.  Your health care provider may also recommend using home test kits to check for hidden blood in the stool.  A small camera at the end of a tube can be used to examine your colon directly  (sigmoidoscopy or colonoscopy). This is done to check for the earliest forms of colorectal cancer.  Routine screening usually begins at age 43.  Direct examination of the colon should be repeated every 5-10 years through 60 years of age. However, you may need to be screened more often if early forms of precancerous polyps or small growths are found.  Skin Cancer  Check your skin from head to toe regularly.  Tell your health care provider about any new moles or changes in moles, especially if there is a change in a mole's shape or color.  Also tell your health care provider if you have a mole that is larger than the size of a pencil eraser.  Always use sunscreen. Apply sunscreen liberally and repeatedly throughout the day.  Protect yourself by wearing long sleeves, pants, a wide-brimmed hat, and sunglasses whenever you are outside.  Heart disease, diabetes, and high blood pressure  High blood pressure causes heart disease and increases the risk of stroke.  High blood pressure is more likely to develop in: ? People who have blood pressure in the high end of the normal range (130-139/85-89 mm Hg). ? People who are overweight or obese. ? People who are African American.  If you are 48-73 years of age, have your blood pressure checked every 3-5 years. If you are 45 years of age or older, have your blood pressure checked every year. You should have your blood pressure measured twice-once when you are at a hospital or clinic, and once when you are not at a hospital or clinic. Record the average of the two measurements. To check your blood pressure when you are not at a hospital or clinic, you can use: ? An automated blood pressure machine at a pharmacy. ? A home blood pressure monitor.  If you are between 11 years and 45 years old, ask your health care provider if you should take aspirin to prevent strokes.  Have regular diabetes screenings. This involves taking a blood sample to check your  fasting blood sugar level. ? If you are at a normal weight and have a low risk for diabetes, have this test once every three years after 60 years of age. ? If you are overweight and have a high risk for diabetes, consider being tested at a younger age or more often. Preventing infection Hepatitis B  If you have a higher risk for hepatitis B, you should be screened for this virus. You are considered at high risk for hepatitis B if: ? You were born in a country where hepatitis B is common. Ask your health care provider which countries are considered high risk. ? Your parents were born in a high-risk country, and you have not been immunized against hepatitis B (hepatitis B vaccine). ? You have HIV or AIDS. ? You use needles to inject street drugs. ? You live with someone who has hepatitis B. ? You have had sex with someone who has hepatitis B. ? You get hemodialysis treatment. ? You take certain medicines for conditions, including cancer, organ transplantation, and autoimmune conditions.  Hepatitis C  Blood testing is recommended for: ? Everyone born from [birth year] through 1964. ? Anyone with known risk factors for hepatitis C.  Sexually transmitted infections (STIs)  You should be screened for sexually transmitted infections (STIs) including gonorrhea and chlamydia if: ? You are sexually active and are younger than 60 years of age. ? You are older than 60 years of age and your health care provider tells you that you are at risk for this type of infection. ? Your sexual activity has changed since you were last screened and you are at an increased risk for chlamydia or gonorrhea. Ask your health care provider if you are at risk.  If you do not have HIV, but are at risk, it may be recommended that you take a prescription medicine daily to prevent HIV infection. This is called pre-exposure prophylaxis (PrEP). You are considered at risk if: ? You are sexually active and do not regularly use condoms  or know the HIV status of your partner(s). ? You take drugs by injection. ? You are sexually active with a partner who has HIV.  Talk with your health care provider about whether you are at high risk of being infected with HIV. If you choose to begin PrEP, you should first be tested for HIV. You should then be tested every 3 months for as long as you are taking PrEP. Pregnancy  If you  are premenopausal and you may become pregnant, ask your health care provider about preconception counseling.  If you may become pregnant, take 400 to 800 micrograms (mcg) of folic acid every day.  If you want to prevent pregnancy, talk to your health care provider about birth control (contraception). Osteoporosis and menopause  Osteoporosis is a disease in which the bones lose minerals and strength with aging. This can result in serious bone fractures. Your risk for osteoporosis can be identified using a bone density scan.  If you are 76 years of age or older, or if you are at risk for osteoporosis and fractures, ask your health care provider if you should be screened.  Ask your health care provider whether you should take a calcium or vitamin D supplement to lower your risk for osteoporosis.  Menopause may have certain physical symptoms and risks.  Hormone replacement therapy may reduce some of these symptoms and risks. Talk to your health care provider about whether hormone replacement therapy is right for you. Follow these instructions at home:  Schedule regular health, dental, and eye exams.  Stay current with your immunizations.  Do not use any tobacco products including cigarettes, chewing tobacco, or electronic cigarettes.  If you are pregnant, do not drink alcohol.  If you are breastfeeding, limit how much and how often you drink alcohol.  Limit alcohol intake to no more than 1 drink per day for nonpregnant women. One drink equals 12 ounces of beer, 5 ounces of wine, or 1 ounces of hard  liquor.  Do not use street drugs.  Do not share needles.  Ask your health care provider for help if you need support or information about quitting drugs.  Tell your health care provider if you often feel depressed.  Tell your health care provider if you have ever been abused or do not feel safe at home. This information is not intended to replace advice given to you by your health care provider. Make sure you discuss any questions you have with your health care provider. Document Released: 03/07/2011 Document Revised: 01/28/2016 Document Reviewed: 05/26/2015 Elsevier Interactive Patient Education  [employer].

## 2017-10-27 ENCOUNTER — Telehealth: Payer: Self-pay | Admitting: Internal Medicine

## 2017-10-27 ENCOUNTER — Ambulatory Visit: Payer: BLUE CROSS/BLUE SHIELD | Admitting: Internal Medicine

## 2017-10-27 ENCOUNTER — Encounter: Payer: Self-pay | Admitting: Internal Medicine

## 2017-10-27 DIAGNOSIS — R300 Dysuria: Secondary | ICD-10-CM | POA: Insufficient documentation

## 2017-10-27 LAB — POCT URINALYSIS DIPSTICK
Bilirubin, UA: NEGATIVE
GLUCOSE UA: NEGATIVE
KETONES UA: NEGATIVE
Nitrite, UA: NEGATIVE
Protein, UA: NEGATIVE
SPEC GRAV UA: 1.015 (ref 1.010–1.025)
Urobilinogen, UA: 0.2 E.U./dL
pH, UA: 6.5 (ref 5.0–8.0)

## 2017-10-27 MED ORDER — RIFAXIMIN 200 MG PO TABS: 200.0000 mg | ORAL_TABLET | Freq: Three times a day (TID) | ORAL | 0 refills | Status: DC

## 2017-10-27 MED ORDER — CIPROFLOXACIN HCL 500 MG PO TABS: 500.0000 mg | ORAL_TABLET | Freq: Two times a day (BID) | ORAL | 0 refills | Status: DC

## 2017-10-27 NOTE — Patient Instructions (Addendum)
We have sent in cipro to take 1 pill twice a day for 5 days.   We have sent in the rifaximin to use if needed 1 pill 3 times per day for 1 week for stomach flares.

## 2017-10-27 NOTE — Progress Notes (Signed)
   Subjective:    Patient ID: Kerri BachAngela Budge, female    DOB: 06/25/1956, 62 y.o.   MRN: [id number]  HPI The patient is a 62 YO female coming in for urinary symptoms. Started about 2-3 days ago. More frequency and urgency. Some burning with urination. Denies trying anything for it. Has been drinking fluids. Overall stable since onset. Has had UTI in the past and this feels consistent. Denies back pain. Mild suprapubic pressure. Denies nausea or vomiting.   Review of Systems  Constitutional: Negative.   HENT: Negative.   Eyes: Negative.   Respiratory: Negative for cough, chest tightness and shortness of breath.   Cardiovascular: Negative for chest pain, palpitations and leg swelling.  Gastrointestinal: Positive for abdominal pain. Negative for abdominal distention, constipation, diarrhea, nausea and vomiting.  Genitourinary: Positive for dysuria, frequency and urgency. Negative for decreased urine volume, difficulty urinating, flank pain, genital sores, hematuria, vaginal bleeding and vaginal discharge.  Musculoskeletal: Negative.   Skin: Negative.   Neurological: Negative.   Psychiatric/Behavioral: Negative.       Objective:   Physical Exam  Constitutional: She is oriented to person, place, and time. She appears well-developed and well-nourished.  HENT:  Head: Normocephalic and atraumatic.  Eyes: EOM are normal.  Neck: Normal range of motion.  Cardiovascular: Normal rate and regular rhythm.  Pulmonary/Chest: Effort normal and breath sounds normal. No respiratory distress. She has no wheezes. She has no rales.  Abdominal: Soft. Bowel sounds are normal. She exhibits no distension. There is no tenderness. There is no rebound.  Musculoskeletal: She exhibits no edema.  Neurological: She is alert and oriented to person, place, and time. Coordination normal.  Skin: Skin is warm and dry.   Vitals:   10/27/17 1044  BP: 120/86  Pulse: 80  Temp: 98.3 F (36.8 C)  TempSrc: Oral  SpO2:  98%  Weight: 163 lb (73.9 kg)  Height: 5\' 6"  (1.676 m)      Assessment & Plan:

## 2017-10-27 NOTE — Telephone Encounter (Signed)
Please disregard message. Pt has made an appt for today at 10:45 with PCP

## 2017-10-27 NOTE — Assessment & Plan Note (Signed)
U/A consistent with UTI and rx for cipro 5 day course.

## 2017-10-27 NOTE — Telephone Encounter (Signed)
Copied from CRM [phone]. Topic: Quick Communication - See Telephone Encounter >> Oct 27, 2017  9:39 AM Diana EvesHoyt, Maryann B wrote: CRM for notification. See Telephone encounter for:  Pt requesting ciprofloxacin 500 mg. For frequent UTIs she states for the past two days she has one.  10/27/17.

## 2017-11-01 ENCOUNTER — Telehealth: Payer: Self-pay

## 2017-11-01 NOTE — Telephone Encounter (Signed)
PA started on CoverMyMeds KEY: [id number]

## 2017-11-09 ENCOUNTER — Telehealth: Payer: Self-pay

## 2017-11-09 NOTE — Telephone Encounter (Signed)
Both vaccines have been labeled and placed in refrig 

## 2017-11-09 NOTE — Telephone Encounter (Signed)
Patient returned call and schedule nurse visit for Monday 11/13/17 at 3:15pm

## 2017-11-09 NOTE — Telephone Encounter (Signed)
Left message asking patient to call back to schedule nurse visit for first shingrix vaccine, and then needs to schedule 2nd injection in 2-4 months---let Adelis Docter,RN at [facility] know if patient schedules appt so that both vaccines can be labeled and held for patient to come in---[facility] if any further questions

## 2017-11-10 ENCOUNTER — Other Ambulatory Visit: Payer: Self-pay | Admitting: Internal Medicine

## 2017-11-10 ENCOUNTER — Telehealth: Payer: Self-pay | Admitting: Internal Medicine

## 2017-11-10 MED ORDER — SULFAMETHOXAZOLE-TRIMETHOPRIM 800-160 MG PO TABS: 1.0000 | ORAL_TABLET | Freq: Two times a day (BID) | ORAL | 0 refills | Status: DC

## 2017-11-10 NOTE — Telephone Encounter (Signed)
Pa Denied

## 2017-11-10 NOTE — Telephone Encounter (Signed)
Notified pt w/MD response.../lmb 

## 2017-11-10 NOTE — Telephone Encounter (Signed)
Copied from CRM [id number]. Topic: Quick Communication - See Telephone Encounter >> Nov 10, 2017 11:55 AM Eston Mouldavis, Zali Kamaka B wrote: CRM for notification. See Telephone encounter for:  Pt states her UTI is coming back and wants to have Dr Okey Duprerawford refill the antibiotic  ciprofloxacin (CIPRO) 500 MG tablet  [facility] [place] - [address] [phone] (Phone) [phone] (Fax)   11/10/17.

## 2017-11-10 NOTE — Telephone Encounter (Signed)
Sent in bactrim to take 1 pill twice a day for 5 days.

## 2017-11-13 ENCOUNTER — Ambulatory Visit (INDEPENDENT_AMBULATORY_CARE_PROVIDER_SITE_OTHER): Payer: BLUE CROSS/BLUE SHIELD | Admitting: *Deleted

## 2017-11-13 DIAGNOSIS — Z23 Encounter for immunization: Secondary | ICD-10-CM

## 2018-01-19 ENCOUNTER — Ambulatory Visit (INDEPENDENT_AMBULATORY_CARE_PROVIDER_SITE_OTHER): Payer: BLUE CROSS/BLUE SHIELD

## 2018-01-19 DIAGNOSIS — Z23 Encounter for immunization: Secondary | ICD-10-CM

## 2018-01-22 ENCOUNTER — Ambulatory Visit: Payer: BLUE CROSS/BLUE SHIELD | Admitting: Internal Medicine

## 2018-04-02 ENCOUNTER — Ambulatory Visit: Payer: Self-pay | Admitting: *Deleted

## 2018-04-02 MED ORDER — NITROFURANTOIN MONOHYD MACRO 100 MG PO CAPS: 100.0000 mg | ORAL_CAPSULE | Freq: Two times a day (BID) | ORAL | 0 refills | Status: DC

## 2018-04-02 NOTE — Telephone Encounter (Signed)
Pt called stating she has a UTI. She is requesting an antibiotic to be called in. No fever or flank pain. She is also having some burning with urination and pain # is 3. She is drinking lots of water and cranberry juice.  Per protocol she needs to be seen within 24 hours. She may need to come in and give a urine sample. So she is requesting Dr. Okey Duprerawford to communicate back with her thru MyChart. [facility] [address] Will call back for worsening symptoms.  Reason for Disposition . Age > 50 years  Answer Assessment - Initial Assessment Questions 1. SYMPTOM: "What's the main symptom you're concerned about?" (e.g., frequency, incontinence)     Frequency and pain with urination 2. ONSET: "When did the  UTI  start?"     Las Week Wednesday 3. PAIN: "Is there any pain?" If so, ask: "How bad is it?" (Scale: 1-10; mild, moderate, severe)     Yes pain #3 4. CAUSE: "What do you think is causing the symptoms?"     UTI 5. OTHER SYMPTOMS: "Do you have any other symptoms?" (e.g., fever, flank pain, blood in urine, pain with urination)     Pain with urination  6. PREGNANCY: "Is there any chance you are pregnant?" "When was your last menstrual period?"     No periods  Protocols used: URINATION PAIN - FEMALE-A-AH, URINARY SYMPTOMS-A-AH

## 2018-04-02 NOTE — Telephone Encounter (Signed)
Sent pt antibiotic and message sent via mychart.

## 2018-07-23 ENCOUNTER — Ambulatory Visit (INDEPENDENT_AMBULATORY_CARE_PROVIDER_SITE_OTHER): Payer: BLUE CROSS/BLUE SHIELD | Admitting: Internal Medicine

## 2018-07-23 ENCOUNTER — Encounter: Payer: Self-pay | Admitting: Internal Medicine

## 2018-07-23 ENCOUNTER — Other Ambulatory Visit (INDEPENDENT_AMBULATORY_CARE_PROVIDER_SITE_OTHER): Payer: BLUE CROSS/BLUE SHIELD

## 2018-07-23 VITALS — BP 110/86 | HR 82 | Temp 97.7°F | Ht 66.0 in | Wt 162.0 lb

## 2018-07-23 DIAGNOSIS — Z0000 Encounter for general adult medical examination without abnormal findings: Secondary | ICD-10-CM

## 2018-07-23 DIAGNOSIS — Z23 Encounter for immunization: Secondary | ICD-10-CM | POA: Diagnosis not present

## 2018-07-23 LAB — LIPID PANEL
CHOL/HDL RATIO: 5
CHOLESTEROL: 221 mg/dL — AB (ref 0–200)
HDL: 48.2 mg/dL (ref >39.00)
LDL CALC: 135 mg/dL — AB (ref 0–99)
NonHDL: 172.41
Triglycerides: 187 mg/dL — ABNORMAL HIGH (ref 0.0–149.0)
VLDL: 37.4 mg/dL (ref 0.0–40.0)

## 2018-07-23 LAB — COMPREHENSIVE METABOLIC PANEL
ALBUMIN: 4.1 g/dL (ref 3.5–5.2)
ALT: 14 U/L (ref 0–35)
AST: 15 U/L (ref 0–37)
Alkaline Phosphatase: 67 U/L (ref 39–117)
BUN: 11 mg/dL (ref 6–23)
CHLORIDE: 103 meq/L (ref 96–112)
CO2: 26 mEq/L (ref 19–32)
CREATININE: 0.87 mg/dL (ref 0.40–1.20)
Calcium: 9.7 mg/dL (ref 8.4–10.5)
GFR: 70.37 mL/min (ref >60.00)
Glucose, Bld: 93 mg/dL (ref 70–99)
Potassium: 4.8 mEq/L (ref 3.5–5.1)
SODIUM: 137 meq/L (ref 135–145)
Total Bilirubin: 0.5 mg/dL (ref 0.2–1.2)
Total Protein: 7.3 g/dL (ref 6.0–8.3)

## 2018-07-23 LAB — CBC
HCT: 43.1 % (ref 36.0–46.0)
Hemoglobin: 14.8 g/dL (ref 12.0–15.0)
MCHC: 34.3 g/dL (ref 30.0–36.0)
MCV: 92.5 fl (ref 78.0–100.0)
Platelets: 281 10*3/uL (ref 150.0–400.0)
RBC: 4.66 Mil/uL (ref 3.87–5.11)
RDW: 12.7 % (ref 11.5–15.5)
WBC: 8.3 10*3/uL (ref 4.0–10.5)

## 2018-07-23 LAB — VITAMIN B12: Vitamin B-12: 212 pg/mL (ref 211–911)

## 2018-07-23 LAB — TSH: TSH: 2.86 u[IU]/mL (ref 0.35–4.50)

## 2018-07-23 NOTE — Progress Notes (Signed)
   Subjective:    Patient ID: Christin BachAngela Fehr, female    DOB: 07/01/1956, 62 y.o.   MRN: [id number]  HPI The patient is a 62 YO female coming in for physical.   [facility], [facility] reviewed and updated.  Review of Systems  Constitutional: Negative.   HENT: Negative.   Eyes: Negative.   Respiratory: Negative for cough, chest tightness and shortness of breath.   Cardiovascular: Negative for chest pain, palpitations and leg swelling.  Gastrointestinal: Negative for abdominal distention, abdominal pain, constipation, diarrhea, nausea and vomiting.  Musculoskeletal: Negative.   Skin: Negative.   Neurological: Negative.   Psychiatric/Behavioral: Negative.       Objective:   Physical Exam  Constitutional: She is oriented to person, place, and time. She appears well-developed and well-nourished.  HENT:  Head: Normocephalic and atraumatic.  Eyes: EOM are normal.  Neck: Normal range of motion.  Cardiovascular: Normal rate and regular rhythm.  Pulmonary/Chest: Effort normal and breath sounds normal. No respiratory distress. She has no wheezes. She has no rales.  Abdominal: Soft. Bowel sounds are normal. She exhibits no distension. There is no tenderness. There is no rebound.  Musculoskeletal: She exhibits no edema.  Neurological: She is alert and oriented to person, place, and time. Coordination normal.  Skin: Skin is warm and dry.  Psychiatric: She has a normal mood and affect.   Vitals:   07/23/18 0756 07/23/18 0844  BP: (!) 142/100 110/86  Pulse: 82   Temp: 97.7 F (36.5 C)   TempSrc: Oral   SpO2: 97%   Weight: 162 lb (73.5 kg)   Height: 5\' 6"  (1.676 m)    EKG: Rate 77, axis normal, interval normal, sinus, no st or t wave changes    Assessment & Plan:  Flu shot given at visit

## 2018-07-23 NOTE — Patient Instructions (Addendum)
The EKG is normal today.   Health Maintenance, Female Adopting a healthy lifestyle and getting preventive care can go a long way to promote health and wellness. Talk with your health care provider about what schedule of regular examinations is right for you. This is a good chance for you to check in with your provider about disease prevention and staying healthy. In between checkups, there are plenty of things you can do on your own. Experts have done a lot of research about which lifestyle changes and preventive measures are most likely to keep you healthy. Ask your health care provider for more information. Weight and diet Eat a healthy diet  Be sure to include plenty of vegetables, fruits, low-fat dairy products, and lean protein.  Do not eat a lot of foods high in solid fats, added sugars, or salt.  Get regular exercise. This is one of the most important things you can do for your health. ? Most adults should exercise for at least 150 minutes each week. The exercise should increase your heart rate and make you sweat (moderate-intensity exercise). ? Most adults should also do strengthening exercises at least twice a week. This is in addition to the moderate-intensity exercise.  Maintain a healthy weight  Body mass index (BMI) is a measurement that can be used to identify possible weight problems. It estimates body fat based on height and weight. Your health care provider can help determine your BMI and help you achieve or maintain a healthy weight.  For females 62 years of age and older: ? A BMI below 18.5 is considered underweight. ? A BMI of 18.5 to 24.9 is normal. ? A BMI of 25 to 29.9 is considered overweight. ? A BMI of 30 and above is considered obese.  Watch levels of cholesterol and blood lipids  You should start having your blood tested for lipids and cholesterol at 61 years of age, then have this test every 5 years.  You may need to have your cholesterol levels checked more  often if: ? Your lipid or cholesterol levels are high. ? You are older than 61 years of age. ? You are at high risk for heart disease.  Cancer screening Lung Cancer  Lung cancer screening is recommended for adults 31-71 years old who are at high risk for lung cancer because of a history of smoking.  A yearly low-dose CT scan of the lungs is recommended for people who: ? Currently smoke. ? Have quit within the past 15 years. ? Have at least a 30-pack-year history of smoking. A pack year is smoking an average of one pack of cigarettes a day for 1 year.  Yearly screening should continue until it has been 15 years since you quit.  Yearly screening should stop if you develop a health problem that would prevent you from having lung cancer treatment.  Breast Cancer  Practice breast self-awareness. This means understanding how your breasts normally appear and feel.  It also means doing regular breast self-exams. Let your health care provider know about any changes, no matter how small.  If you are in your 20s or 30s, you should have a clinical breast exam (CBE) by a health care provider every 1-3 years as part of a regular health exam.  If you are 89 or older, have a CBE every year. Also consider having a breast X-ray (mammogram) every year.  If you have a family history of breast cancer, talk to your health care provider about genetic screening.  If you are at high risk for breast cancer, talk to your health care provider about having an MRI and a mammogram every year.  Breast cancer gene (BRCA) assessment is recommended for women who have family members with BRCA-related cancers. BRCA-related cancers include: ? Breast. ? Ovarian. ? Tubal. ? Peritoneal cancers.  Results of the assessment will determine the need for genetic counseling and BRCA1 and BRCA2 testing.  Cervical Cancer Your health care provider may recommend that you be screened regularly for cancer of the pelvic organs  (ovaries, uterus, and vagina). This screening involves a pelvic examination, including checking for microscopic changes to the surface of your cervix (Pap test). You may be encouraged to have this screening done every 3 years, beginning at age 27.  For women ages 19-65, health care providers may recommend pelvic exams and Pap testing every 3 years, or they may recommend the Pap and pelvic exam, combined with testing for human papilloma virus (HPV), every 5 years. Some types of HPV increase your risk of cervical cancer. Testing for HPV may also be done on women of any age with unclear Pap test results.  Other health care providers may not recommend any screening for nonpregnant women who are considered low risk for pelvic cancer and who do not have symptoms. Ask your health care provider if a screening pelvic exam is right for you.  If you have had past treatment for cervical cancer or a condition that could lead to cancer, you need Pap tests and screening for cancer for at least 20 years after your treatment. If Pap tests have been discontinued, your risk factors (such as having a new sexual partner) need to be reassessed to determine if screening should resume. Some women have medical problems that increase the chance of getting cervical cancer. In these cases, your health care provider may recommend more frequent screening and Pap tests.  Colorectal Cancer  This type of cancer can be detected and often prevented.  Routine colorectal cancer screening usually begins at 61 years of age and continues through 60 years of age.  Your health care provider may recommend screening at an earlier age if you have risk factors for colon cancer.  Your health care provider may also recommend using home test kits to check for hidden blood in the stool.  A small camera at the end of a tube can be used to examine your colon directly (sigmoidoscopy or colonoscopy). This is done to check for the earliest forms of  colorectal cancer.  Routine screening usually begins at age 23.  Direct examination of the colon should be repeated every 5-10 years through 61 years of age. However, you may need to be screened more often if early forms of precancerous polyps or small growths are found.  Skin Cancer  Check your skin from head to toe regularly.  Tell your health care provider about any new moles or changes in moles, especially if there is a change in a mole's shape or color.  Also tell your health care provider if you have a mole that is larger than the size of a pencil eraser.  Always use sunscreen. Apply sunscreen liberally and repeatedly throughout the day.  Protect yourself by wearing long sleeves, pants, a wide-brimmed hat, and sunglasses whenever you are outside.  Heart disease, diabetes, and high blood pressure  High blood pressure causes heart disease and increases the risk of stroke. High blood pressure is more likely to develop in: ? People who have blood  pressure in the high end of the normal range (130-139/85-89 mm Hg). ? People who are overweight or obese. ? People who are African American.  If you are 65-57 years of age, have your blood pressure checked every 3-5 years. If you are 45 years of age or older, have your blood pressure checked every year. You should have your blood pressure measured twice-once when you are at a hospital or clinic, and once when you are not at a hospital or clinic. Record the average of the two measurements. To check your blood pressure when you are not at a hospital or clinic, you can use: ? An automated blood pressure machine at a pharmacy. ? A home blood pressure monitor.  If you are between 10 years and 29 years old, ask your health care provider if you should take aspirin to prevent strokes.  Have regular diabetes screenings. This involves taking a blood sample to check your fasting blood sugar level. ? If you are at a normal weight and have a low risk  for diabetes, have this test once every three years after 61 years of age. ? If you are overweight and have a high risk for diabetes, consider being tested at a younger age or more often. Preventing infection Hepatitis B  If you have a higher risk for hepatitis B, you should be screened for this virus. You are considered at high risk for hepatitis B if: ? You were born in a country where hepatitis B is common. Ask your health care provider which countries are considered high risk. ? Your parents were born in a high-risk country, and you have not been immunized against hepatitis B (hepatitis B vaccine). ? You have HIV or AIDS. ? You use needles to inject street drugs. ? You live with someone who has hepatitis B. ? You have had sex with someone who has hepatitis B. ? You get hemodialysis treatment. ? You take certain medicines for conditions, including cancer, organ transplantation, and autoimmune conditions.  Hepatitis C  Blood testing is recommended for: ? Everyone born from [birth year] through 1964. ? Anyone with known risk factors for hepatitis C.  Sexually transmitted infections (STIs)  You should be screened for sexually transmitted infections (STIs) including gonorrhea and chlamydia if: ? You are sexually active and are younger than 61 years of age. ? You are older than 61 years of age and your health care provider tells you that you are at risk for this type of infection. ? Your sexual activity has changed since you were last screened and you are at an increased risk for chlamydia or gonorrhea. Ask your health care provider if you are at risk.  If you do not have HIV, but are at risk, it may be recommended that you take a prescription medicine daily to prevent HIV infection. This is called pre-exposure prophylaxis (PrEP). You are considered at risk if: ? You are sexually active and do not regularly use condoms or know the HIV status of your partner(s). ? You take drugs by  injection. ? You are sexually active with a partner who has HIV.  Talk with your health care provider about whether you are at high risk of being infected with HIV. If you choose to begin PrEP, you should first be tested for HIV. You should then be tested every 3 months for as long as you are taking PrEP. Pregnancy  If you are premenopausal and you may become pregnant, ask your health care provider about preconception  counseling.  If you may become pregnant, take 400 to 800 micrograms (mcg) of folic acid every day.  If you want to prevent pregnancy, talk to your health care provider about birth control (contraception). Osteoporosis and menopause  Osteoporosis is a disease in which the bones lose minerals and strength with aging. This can result in serious bone fractures. Your risk for osteoporosis can be identified using a bone density scan.  If you are 74 years of age or older, or if you are at risk for osteoporosis and fractures, ask your health care provider if you should be screened.  Ask your health care provider whether you should take a calcium or vitamin D supplement to lower your risk for osteoporosis.  Menopause may have certain physical symptoms and risks.  Hormone replacement therapy may reduce some of these symptoms and risks. Talk to your health care provider about whether hormone replacement therapy is right for you. Follow these instructions at home:  Schedule regular health, dental, and eye exams.  Stay current with your immunizations.  Do not use any tobacco products including cigarettes, chewing tobacco, or electronic cigarettes.  If you are pregnant, do not drink alcohol.  If you are breastfeeding, limit how much and how often you drink alcohol.  Limit alcohol intake to no more than 1 drink per day for nonpregnant women. One drink equals 12 ounces of beer, 5 ounces of wine, or 1 ounces of hard liquor.  Do not use street drugs.  Do not share needles.  Ask  your health care provider for help if you need support or information about quitting drugs.  Tell your health care provider if you often feel depressed.  Tell your health care provider if you have ever been abused or do not feel safe at home. This information is not intended to replace advice given to you by your health care provider. Make sure you discuss any questions you have with your health care provider. Document Released: 03/07/2011 Document Revised: 01/28/2016 Document Reviewed: 05/26/2015 Elsevier Interactive Patient Education  [employer].   Endometriosis Endometriosis is a condition in which the tissue that lines the uterus (endometrium) grows outside of its normal location. The tissue may grow in many locations close to the uterus, but it commonly grows on the ovaries, fallopian tubes, vagina, or bowel. When the uterus sheds the endometrium every menstrual cycle, there is bleeding wherever the endometrial tissue is located. This can cause pain because blood is irritating to tissues that are not normally exposed to it. What are the causes? The cause of endometriosis is not known. What increases the risk? You may be more likely to develop endometriosis if you:  Have a family history of endometriosis.  Have never given birth.  Started your period at age 70 or younger.  Have high levels of estrogen in your body.  Were exposed to a certain medicine (diethylstilbestrol) before you were born (in utero).  Had low birth weight.  Were born as a twin, triplet, or other multiple.  Have a BMI of less than 25. BMI is an estimate of body fat and is calculated from height and weight.  What are the signs or symptoms? Often, there are no symptoms of this condition. If you do have symptoms, they may:  Vary depending on where your endometrial tissue is growing.  Occur during your menstrual period (most common) or midcycle.  Come and go, or you may go months with no symptoms at  all.  Stop with menopause.  Symptoms may include:  Pain in the back or abdomen.  Heavier bleeding during periods.  Pain during sex.  Painful bowel movements.  Infertility.  Pelvic pain.  Bleeding more than once a month.  How is this diagnosed? This condition is diagnosed based on your symptoms and a physical exam. You may have tests, such as:  Blood tests and urine tests. These may be done to help rule out other possible causes of your symptoms.  Ultrasound, to look for abnormal tissues.  An X-ray of the lower bowel (barium enema).  An ultrasound that is done through the vagina (transvaginally).  CT scan.  MRI.  Laparoscopy. In this procedure, a lighted, pencil-sized instrument called a laparoscope is inserted into your abdomen through an incision. The laparoscope allows your health care provider to look at the organs inside your body and check for abnormal tissue to confirm the diagnosis. If abnormal tissue is found, your health care provider may remove a small piece of tissue (biopsy) to be examined under a microscope.  How is this treated? Treatment for this condition may include:  Medicines to relieve pain, such as NSAIDs.  Hormone therapy. This involves using artificial (synthetic) hormones to reduce endometrial tissue growth. Your health care provider may recommend using a hormonal form of birth control, or other medicines.  Surgery. This may be done to remove abnormal endometrial tissue. ? In some cases, tissue may be removed using a laparoscope and a laser (laparoscopic laser treatment). ? In severe cases, surgery may be done to remove the fallopian tubes, uterus, and ovaries (hysterectomy).  Follow these instructions at home:  Take over-the-counter and prescription medicines only as told by your health care provider.  Do not drive or use heavy machinery while taking prescription pain medicine.  Try to avoid activities that cause pain, including sexual  activity.  Keep all follow-up visits as told by your health care provider. This is important. Contact a health care provider if:  You have pain in the area between your hip bones (pelvic area) that occurs: ? Before, during, or after your period. ? In between your period and gets worse during your period. ? During or after sex. ? With bowel movements or urination, especially during your period.  You have problems getting pregnant.  You have a fever. Get help right away if:  You have severe pain that does not get better with medicine.  You have severe nausea and vomiting, or you cannot eat without vomiting.  You have pain that affects only the lower, right side of your abdomen.  You have abdominal pain that gets worse.  You have abdominal swelling.  You have blood in your stool. This information is not intended to replace advice given to you by your health care provider. Make sure you discuss any questions you have with your health care provider. Document Released: 08/19/2000 Document Revised: 05/27/2016 Document Reviewed: 01/23/2016 Elsevier Interactive Patient Education  [employer].

## 2018-07-23 NOTE — Assessment & Plan Note (Signed)
Flu shot given. Shingrix complete. Tetanus up to date. Colonoscopy up to date. Mammogram up to date, pap smear up to date. Counseled about sun safety and mole surveillance. Counseled about the dangers of distracted driving. Given 10 year screening recommendations.   

## 2018-08-16 ENCOUNTER — Other Ambulatory Visit: Payer: Self-pay | Admitting: Internal Medicine

## 2018-08-16 NOTE — Telephone Encounter (Signed)
LVM for patient to call back for MD response  

## 2018-08-16 NOTE — Telephone Encounter (Signed)
Patient states the eczema is located mostly on her hands in the winter. And forearms.

## 2018-08-16 NOTE — Telephone Encounter (Signed)
Where is the eczema located? This cream is very strong and cannot be used certain locations.

## 2018-08-16 NOTE — Telephone Encounter (Signed)
Copied from CRM [phone]. Topic: General - Other >> Aug 16, 2018  1:16 PM Leafy Roobinson, Norma J wrote: Reason for CRM: pt is calling and would like new rx betamethasone valerate cream 0.1% 45 grams. This med was prescribed about 2 years ago. Pt uses this for her eczema. [place] [facility]

## 2018-08-17 MED ORDER — TRIAMCINOLONE ACETONIDE 0.1 % EX CREA: 1.0000 "application " | TOPICAL_CREAM | Freq: Two times a day (BID) | CUTANEOUS | 3 refills | Status: DC

## 2018-08-17 NOTE — Addendum Note (Signed)
Addended by: Hillard DankerRAWFORD, Harlow Basley A on: 08/17/2018 09:22 AM   Modules accepted: Orders

## 2018-08-17 NOTE — Telephone Encounter (Signed)
Sent in cream to use BID if needed and would recommend vaseline for moisture at least daily or BID during winter to help avoid flare.

## 2018-10-21 ENCOUNTER — Inpatient Hospital Stay (HOSPITAL_COMMUNITY): Payer: BLUE CROSS/BLUE SHIELD | Admitting: Certified Registered"

## 2018-10-21 ENCOUNTER — Observation Stay (HOSPITAL_BASED_OUTPATIENT_CLINIC_OR_DEPARTMENT_OTHER)
Admission: EM | Admit: 2018-10-21 | Discharge: 2018-10-21 | Disposition: A | Discharge status: Home / Self Care | Payer: BLUE CROSS/BLUE SHIELD | Attending: Orthopedic Surgery | Admitting: Orthopedic Surgery

## 2018-10-21 ENCOUNTER — Encounter (HOSPITAL_BASED_OUTPATIENT_CLINIC_OR_DEPARTMENT_OTHER): Payer: Self-pay | Admitting: Emergency Medicine

## 2018-10-21 ENCOUNTER — Encounter (HOSPITAL_COMMUNITY)
Admission: EM | Disposition: A | Discharge status: Home / Self Care | Payer: Self-pay | Source: Home / Self Care | Attending: Orthopedic Surgery

## 2018-10-21 ENCOUNTER — Emergency Department (HOSPITAL_BASED_OUTPATIENT_CLINIC_OR_DEPARTMENT_OTHER): Payer: BLUE CROSS/BLUE SHIELD

## 2018-10-21 ENCOUNTER — Other Ambulatory Visit: Payer: Self-pay

## 2018-10-21 ENCOUNTER — Ambulatory Visit: Admit: 2018-10-21 | Payer: BLUE CROSS/BLUE SHIELD | Admitting: Orthopedic Surgery

## 2018-10-21 DIAGNOSIS — Z791 Long term (current) use of non-steroidal anti-inflammatories (NSAID): Secondary | ICD-10-CM | POA: Insufficient documentation

## 2018-10-21 DIAGNOSIS — S52501B Unspecified fracture of the lower end of right radius, initial encounter for open fracture type I or II: Secondary | ICD-10-CM

## 2018-10-21 DIAGNOSIS — E039 Hypothyroidism, unspecified: Secondary | ICD-10-CM | POA: Insufficient documentation

## 2018-10-21 DIAGNOSIS — S4991XA Unspecified injury of right shoulder and upper arm, initial encounter: Secondary | ICD-10-CM | POA: Diagnosis not present

## 2018-10-21 DIAGNOSIS — S52501A Unspecified fracture of the lower end of right radius, initial encounter for closed fracture: Secondary | ICD-10-CM | POA: Diagnosis not present

## 2018-10-21 DIAGNOSIS — S52551B Other extraarticular fracture of lower end of right radius, initial encounter for open fracture type I or II: Principal | ICD-10-CM | POA: Insufficient documentation

## 2018-10-21 DIAGNOSIS — M1990 Unspecified osteoarthritis, unspecified site: Secondary | ICD-10-CM | POA: Insufficient documentation

## 2018-10-21 DIAGNOSIS — W010XXA Fall on same level from slipping, tripping and stumbling without subsequent striking against object, initial encounter: Secondary | ICD-10-CM | POA: Diagnosis not present

## 2018-10-21 DIAGNOSIS — Z23 Encounter for immunization: Secondary | ICD-10-CM | POA: Diagnosis not present

## 2018-10-21 DIAGNOSIS — M25511 Pain in right shoulder: Secondary | ICD-10-CM | POA: Diagnosis not present

## 2018-10-21 DIAGNOSIS — S52591A Other fractures of lower end of right radius, initial encounter for closed fracture: Secondary | ICD-10-CM | POA: Diagnosis not present

## 2018-10-21 DIAGNOSIS — Z79899 Other long term (current) drug therapy: Secondary | ICD-10-CM | POA: Insufficient documentation

## 2018-10-21 DIAGNOSIS — S52601B Unspecified fracture of lower end of right ulna, initial encounter for open fracture type I or II: Secondary | ICD-10-CM | POA: Diagnosis not present

## 2018-10-21 DIAGNOSIS — I10 Essential (primary) hypertension: Secondary | ICD-10-CM | POA: Insufficient documentation

## 2018-10-21 DIAGNOSIS — Y9302 Activity, running: Secondary | ICD-10-CM | POA: Insufficient documentation

## 2018-10-21 DIAGNOSIS — S52571B Other intraarticular fracture of lower end of right radius, initial encounter for open fracture type I or II: Secondary | ICD-10-CM | POA: Diagnosis not present

## 2018-10-21 DIAGNOSIS — S52251B Displaced comminuted fracture of shaft of ulna, right arm, initial encounter for open fracture type I or II: Secondary | ICD-10-CM | POA: Diagnosis not present

## 2018-10-21 HISTORY — PX: ORIF WRIST FRACTURE: SHX2133

## 2018-10-21 SURGERY — OPEN REDUCTION INTERNAL FIXATION (ORIF) WRIST FRACTURE
Anesthesia: Monitor Anesthesia Care | Site: Wrist | Laterality: Right

## 2018-10-21 MED ORDER — CEFAZOLIN SODIUM-DEXTROSE 1-4 GM/50ML-% IV SOLN
1.0000 g | Freq: Once | INTRAVENOUS | Status: AC
  Administered 2018-10-21: 1 g via INTRAVENOUS
  Filled 2018-10-21: qty 50

## 2018-10-21 MED ORDER — ONDANSETRON HCL 4 MG/2ML IJ SOLN: 4.0000 mg | Freq: Once | INTRAMUSCULAR | Status: DC | PRN

## 2018-10-21 MED ORDER — FENTANYL CITRATE (PF) 250 MCG/5ML IJ SOLN
INTRAMUSCULAR | Status: AC
  Filled 2018-10-21: qty 5

## 2018-10-21 MED ORDER — 0.9 % SODIUM CHLORIDE (POUR BTL) OPTIME
TOPICAL | Status: DC | PRN
  Administered 2018-10-21: 1000 mL

## 2018-10-21 MED ORDER — ONDANSETRON HCL 4 MG/2ML IJ SOLN
INTRAMUSCULAR | Status: AC
  Filled 2018-10-21: qty 2

## 2018-10-21 MED ORDER — CEPHALEXIN 500 MG PO CAPS: 500.0000 mg | ORAL_CAPSULE | Freq: Four times a day (QID) | ORAL | 0 refills | Status: AC

## 2018-10-21 MED ORDER — GABAPENTIN 300 MG PO CAPS: 300.0000 mg | ORAL_CAPSULE | Freq: Two times a day (BID) | ORAL | 0 refills | Status: DC

## 2018-10-21 MED ORDER — PROPOFOL 1000 MG/100ML IV EMUL
INTRAVENOUS | Status: AC
  Filled 2018-10-21: qty 100

## 2018-10-21 MED ORDER — PROPOFOL 10 MG/ML IV BOLUS
INTRAVENOUS | Status: AC
  Filled 2018-10-21: qty 20

## 2018-10-21 MED ORDER — MIDAZOLAM HCL 2 MG/2ML IJ SOLN
INTRAMUSCULAR | Status: AC
  Filled 2018-10-21: qty 2

## 2018-10-21 MED ORDER — OXYCODONE HCL 5 MG PO TABS: 5.0000 mg | ORAL_TABLET | ORAL | 0 refills | Status: AC | PRN

## 2018-10-21 MED ORDER — FENTANYL CITRATE (PF) 100 MCG/2ML IJ SOLN
INTRAMUSCULAR | Status: DC | PRN
  Administered 2018-10-21 (×2): 25 ug via INTRAVENOUS

## 2018-10-21 MED ORDER — OXYCODONE HCL 5 MG/5ML PO SOLN: 5.0000 mg | Freq: Once | ORAL | Status: DC | PRN

## 2018-10-21 MED ORDER — LACTATED RINGERS IV SOLN
INTRAVENOUS | Status: DC | PRN
  Administered 2018-10-21: 17:00:00 via INTRAVENOUS

## 2018-10-21 MED ORDER — DOCUSATE SODIUM 100 MG PO CAPS: 100.0000 mg | ORAL_CAPSULE | Freq: Two times a day (BID) | ORAL | 0 refills | Status: DC

## 2018-10-21 MED ORDER — CEFAZOLIN SODIUM-DEXTROSE 2-3 GM-%(50ML) IV SOLR
INTRAVENOUS | Status: DC | PRN
  Administered 2018-10-21: 2 g via INTRAVENOUS

## 2018-10-21 MED ORDER — ONDANSETRON HCL 4 MG/2ML IJ SOLN
INTRAMUSCULAR | Status: DC | PRN
  Administered 2018-10-21: 4 mg via INTRAVENOUS

## 2018-10-21 MED ORDER — FENTANYL CITRATE (PF) 100 MCG/2ML IJ SOLN: 25.0000 ug | INTRAMUSCULAR | Status: DC | PRN

## 2018-10-21 MED ORDER — MIDAZOLAM HCL 5 MG/5ML IJ SOLN
INTRAMUSCULAR | Status: DC | PRN
  Administered 2018-10-21: 2 mg via INTRAVENOUS

## 2018-10-21 MED ORDER — ONDANSETRON HCL 4 MG PO TABS: 4.0000 mg | ORAL_TABLET | Freq: Three times a day (TID) | ORAL | 0 refills | Status: DC | PRN

## 2018-10-21 MED ORDER — CEFAZOLIN SODIUM 1 G IJ SOLR
INTRAMUSCULAR | Status: AC
  Filled 2018-10-21: qty 20

## 2018-10-21 MED ORDER — HYDROMORPHONE HCL 1 MG/ML IJ SOLN
1.0000 mg | Freq: Once | INTRAMUSCULAR | Status: AC
  Administered 2018-10-21: 1 mg via INTRAVENOUS
  Filled 2018-10-21: qty 1

## 2018-10-21 MED ORDER — ONDANSETRON HCL 4 MG/2ML IJ SOLN
4.0000 mg | Freq: Once | INTRAMUSCULAR | Status: AC
  Administered 2018-10-21: 4 mg via INTRAVENOUS
  Filled 2018-10-21: qty 2

## 2018-10-21 MED ORDER — TETANUS-DIPHTH-ACELL PERTUSSIS 5-2.5-18.5 LF-MCG/0.5 IM SUSP
0.5000 mL | Freq: Once | INTRAMUSCULAR | Status: AC
  Administered 2018-10-21: 0.5 mL via INTRAMUSCULAR
  Filled 2018-10-21: qty 0.5

## 2018-10-21 MED ORDER — PROPOFOL 500 MG/50ML IV EMUL
INTRAVENOUS | Status: DC | PRN
  Administered 2018-10-21: 50 ug/kg/min via INTRAVENOUS

## 2018-10-21 MED ORDER — METOCLOPRAMIDE HCL 5 MG/ML IJ SOLN
10.0000 mg | Freq: Once | INTRAMUSCULAR | Status: AC
  Administered 2018-10-21: 10 mg via INTRAVENOUS
  Filled 2018-10-21: qty 2

## 2018-10-21 MED ORDER — OXYCODONE HCL 5 MG PO TABS: 5.0000 mg | ORAL_TABLET | Freq: Once | ORAL | Status: DC | PRN

## 2018-10-21 MED ORDER — CELECOXIB 200 MG PO CAPS: 200.0000 mg | ORAL_CAPSULE | Freq: Two times a day (BID) | ORAL | 0 refills | Status: AC

## 2018-10-21 MED ORDER — ACETAMINOPHEN 500 MG PO TABS: 1000.0000 mg | ORAL_TABLET | Freq: Three times a day (TID) | ORAL | 0 refills | Status: AC

## 2018-10-21 MED ORDER — SODIUM CHLORIDE 0.9 % IV SOLN
INTRAVENOUS | Status: DC | PRN
  Administered 2018-10-21: 14:00:00 via INTRAVENOUS

## 2018-10-21 MED ORDER — METHOCARBAMOL 500 MG PO TABS: 500.0000 mg | ORAL_TABLET | Freq: Three times a day (TID) | ORAL | 0 refills | Status: DC | PRN

## 2018-10-21 MED ORDER — SODIUM CHLORIDE 0.9 % IR SOLN
Status: DC | PRN
  Administered 2018-10-21: 1000 mL

## 2018-10-21 SURGICAL SUPPLY — 70 items
BANDAGE ACE 3X5.8 VEL STRL LF (GAUZE/BANDAGES/DRESSINGS) ×2 IMPLANT
BANDAGE ACE 4X5 VEL STRL LF (GAUZE/BANDAGES/DRESSINGS) ×2 IMPLANT
BENZOIN TINCTURE PRP APPL 2/3 (GAUZE/BANDAGES/DRESSINGS) ×2 IMPLANT
BIT DRILL 2.2 SS TIBIAL (BIT) ×2 IMPLANT
BLADE CLIPPER SURG (BLADE) IMPLANT
BNDG COHESIVE 4X5 TAN STRL (GAUZE/BANDAGES/DRESSINGS) ×2 IMPLANT
BNDG ESMARK 4X9 LF (GAUZE/BANDAGES/DRESSINGS) ×2 IMPLANT
BNDG GAUZE ELAST 4 BULKY (GAUZE/BANDAGES/DRESSINGS) ×2 IMPLANT
CORDS BIPOLAR (ELECTRODE) ×2 IMPLANT
COVER SURGICAL LIGHT HANDLE (MISCELLANEOUS) ×4 IMPLANT
COVER WAND RF STERILE (DRAPES) ×2 IMPLANT
CUFF TOURNIQUET SINGLE 18IN (TOURNIQUET CUFF) ×2 IMPLANT
CUFF TOURNIQUET SINGLE 24IN (TOURNIQUET CUFF) IMPLANT
DECANTER SPIKE VIAL GLASS SM (MISCELLANEOUS) IMPLANT
DRAPE OEC MINIVIEW 54X84 (DRAPES) ×2 IMPLANT
DRAPE U-SHAPE 47X51 STRL (DRAPES) ×2 IMPLANT
DRSG ADAPTIC 3X8 NADH LF (GAUZE/BANDAGES/DRESSINGS) ×2 IMPLANT
ELECT REM PT RETURN 9FT ADLT (ELECTROSURGICAL) ×2
ELECTRODE REM PT RTRN 9FT ADLT (ELECTROSURGICAL) ×1 IMPLANT
GAUZE SPONGE 4X4 12PLY STRL (GAUZE/BANDAGES/DRESSINGS) ×2 IMPLANT
GAUZE XEROFORM 1X8 LF (GAUZE/BANDAGES/DRESSINGS) ×2 IMPLANT
GLOVE BIO SURGEON STRL SZ7.5 (GLOVE) ×4 IMPLANT
GLOVE BIOGEL PI IND STRL 8 (GLOVE) ×2 IMPLANT
GLOVE BIOGEL PI INDICATOR 8 (GLOVE) ×2
GOWN STRL REUS W/ TWL LRG LVL3 (GOWN DISPOSABLE) ×2 IMPLANT
GOWN STRL REUS W/ TWL XL LVL3 (GOWN DISPOSABLE) ×1 IMPLANT
GOWN STRL REUS W/TWL LRG LVL3 (GOWN DISPOSABLE) ×2
GOWN STRL REUS W/TWL XL LVL3 (GOWN DISPOSABLE) ×1
K-WIRE 1.6 (WIRE) ×2
K-WIRE FX5X1.6XNS BN SS (WIRE) ×2
KIT BASIN OR (CUSTOM PROCEDURE TRAY) ×2 IMPLANT
KIT TURNOVER KIT B (KITS) ×2 IMPLANT
KWIRE FX5X1.6XNS BN SS (WIRE) ×2 IMPLANT
MANIFOLD NEPTUNE II (INSTRUMENTS) ×2 IMPLANT
NEEDLE HYPO 25GX1X1/2 BEV (NEEDLE) IMPLANT
NS IRRIG 1000ML POUR BTL (IV SOLUTION) ×4 IMPLANT
PACK ORTHO EXTREMITY (CUSTOM PROCEDURE TRAY) ×2 IMPLANT
PAD ARMBOARD 7.5X6 YLW CONV (MISCELLANEOUS) ×4 IMPLANT
PAD CAST 4YDX4 CTTN HI CHSV (CAST SUPPLIES) ×1 IMPLANT
PADDING CAST COTTON 4X4 STRL (CAST SUPPLIES) ×1
PEG LOCKING SMOOTH 2.2X18 (Peg) ×8 IMPLANT
PEG LOCKING SMOOTH 2.2X20 (Screw) ×6 IMPLANT
PLATE DVR CROSSLOCK STD RT (Plate) ×2 IMPLANT
PLATE DVR ULNA (Plate) ×2 IMPLANT
SCREW  LP NL 2.7X13MM (Screw) ×1 IMPLANT
SCREW  LP NL 2.7X15MM (Screw) ×1 IMPLANT
SCREW 2.7X14MM (Screw) ×1 IMPLANT
SCREW BN 14X2.7XNONLOCK 3 LD (Screw) ×1 IMPLANT
SCREW LOCK 14X2.7X 3 LD TPR (Screw) ×4 IMPLANT
SCREW LOCK 16X2.7X 3 LD TPR (Screw) ×1 IMPLANT
SCREW LOCKING 2.7X14 (Screw) ×4 IMPLANT
SCREW LOCKING 2.7X16 (Screw) ×1 IMPLANT
SCREW LP NL 2.7X13MM (Screw) ×1 IMPLANT
SCREW LP NL 2.7X15MM (Screw) ×1 IMPLANT
SPLINT PLASTER EXTRA FAST 3X15 (CAST SUPPLIES) ×1
SPLINT PLASTER GYPS XFAST 3X15 (CAST SUPPLIES) ×1 IMPLANT
SPONGE LAP 4X18 RFD (DISPOSABLE) ×2 IMPLANT
STRIP CLOSURE SKIN 1/2X4 (GAUZE/BANDAGES/DRESSINGS) ×2 IMPLANT
SUT ETHILON 3 0 PS 1 (SUTURE) ×2 IMPLANT
SUT ETHILON 4 0 PS 2 18 (SUTURE) ×2 IMPLANT
SUT MNCRL AB 4-0 PS2 18 (SUTURE) ×2 IMPLANT
SUT VIC AB 2-0 CT1 27 (SUTURE) ×1
SUT VIC AB 2-0 CT1 TAPERPNT 27 (SUTURE) ×1 IMPLANT
SYR CONTROL 10ML LL (SYRINGE) IMPLANT
TOWEL OR 17X24 6PK STRL BLUE (TOWEL DISPOSABLE) ×2 IMPLANT
TOWEL OR 17X26 10 PK STRL BLUE (TOWEL DISPOSABLE) ×2 IMPLANT
TOWEL OR NON WOVEN STRL DISP B (DISPOSABLE) ×2 IMPLANT
TUBE CONNECTING 12X1/4 (SUCTIONS) ×2 IMPLANT
WATER STERILE IRR 1000ML POUR (IV SOLUTION) ×4 IMPLANT
YANKAUER SUCT BULB TIP NO VENT (SUCTIONS) ×2 IMPLANT

## 2018-10-21 NOTE — ED Triage Notes (Signed)
Reports fall this morning while walking dog.  Obvious deformity to right forearm.  Abrasions noted to forehead and forearm.  Denies LOC.

## 2018-10-21 NOTE — ED Provider Notes (Signed)
[facility] EMERGENCY DEPARTMENT Provider Note   CSN: [id number] Arrival date & time: 10/21/18  1215     History   Chief Complaint Chief Complaint  Patient presents with  . Arm Injury    HPI Kerri Allen is a 63 y.o. female.  Patient is a healthy 63 year old female who only takes medication for hypothyroid disease presenting today after she fell and hurt her arm.  Patient states she was running with her dog when she did not notice a speed bump and tripped over it falling face first onto the concrete.  She has significant pain in her right wrist with bleeding but has normal sensation in her hand.  She is also complaining of some pain in her knee and on the inside of the right side of her mouth.  She denies any dental injury or neck pain.  She did not lose consciousness and denies history of anticoagulation use.  The history is provided by the patient.  Arm Injury  Location:  Wrist Wrist location:  R wrist Injury: yes   Time since incident:  20 minutes Mechanism of injury: fall   Fall:    Fall occurred:  Tripped and running   Impact surface:  Primary school teacher of impact:  Face, outstretched arms and knees   Entrapped after fall: no   Pain details:    Quality:  Shooting, throbbing and sharp   Radiates to:  Does not radiate   Severity:  Severe   Onset quality:  Sudden   Timing:  Constant   Progression:  Unchanged Handedness:  Left-handed Dislocation: no   Foreign body present:  No foreign bodies Tetanus status:  Out of date Prior injury to area:  No Relieved by:  Nothing Worsened by:  Movement Ineffective treatments:  None tried Associated symptoms: decreased range of motion   Associated symptoms: no back pain, no muscle weakness and no neck pain   Associated symptoms comment:  Mild facial pain over the right eye and right knee pain.  Normal sensation of the fingers of the right hand.  No LOC or anticoagulation.   Past Medical History:  Diagnosis Date  .  Arthritis   . Hypertension   . Thyroid disease     Patient Active Problem List   Diagnosis Date Noted  . Open fracture of right distal radius and ulna 10/21/2018  . Dysuria 10/27/2017  . Routine general medical examination at a health care facility 07/22/2014    History reviewed. No pertinent surgical history.   OB History   No obstetric history on file.      Home Medications    Prior to Admission medications   Medication Sig Start Date End Date Taking? Authorizing Provider  Acetaminophen (TYLENOL ARTHRITIS PAIN PO) Take by mouth 2 (two) times daily.   Yes [provider]  carbonyl iron (EQL CARBONYL IRON) 45 MG TABS tablet  06/05/17  Yes [provider]  cholecalciferol (VITAMIN D3) 25 MCG (1000 UT) tablet Take 2,000 Units by mouth 3 (three) times a week.   Yes [provider]  LACTASE ENZYME PO Take 5 mg by mouth daily.   Yes [provider]  Lactobacillus-Inulin (CULTURELLE DIGESTIVE HEALTH) CAPS  09/05/17  Yes [provider]  Omeprazole 20 MG TBDD  09/05/17  Yes [provider]  thiamine (VITAMIN B-1) 100 MG tablet Take 100 mg by mouth daily.   Yes [provider]  clindamycin (CLEOCIN T) 1 % lotion Apply topically 2 (two) times  daily. 07/20/16   Myrlene Brokerrawford, Elizabeth A, MD  triamcinolone cream (KENALOG) 0.1 % Apply 1 application topically 2 (two) times daily. 08/17/18   Myrlene Brokerrawford, Elizabeth A, MD    Family History Family History  Problem Relation Age of Onset  . Parkinson's disease Mother     Social History Social History   Tobacco Use  . Smoking status: Never Smoker  . Smokeless tobacco: Never Used  Substance Use Topics  . Alcohol use: Not on file  . Drug use: Not on file     Allergies   Patient has no known allergies.   Review of Systems Review of Systems  Musculoskeletal: Negative for back pain and neck pain.  All other systems reviewed and are negative.    Physical Exam Updated Vital  Signs BP (!) 158/91 (BP Location: Left Arm)   Pulse 68   Temp 97.7 F (36.5 C) (Oral)   Resp 18   Ht 5\' 6"  (1.676 m)   Wt 72.6 kg   SpO2 100%   BMI 25.82 kg/m   Physical Exam Vitals signs and nursing note reviewed.  Constitutional:      General: She is not in acute distress.    Appearance: She is well-developed.  HENT:     Head: Normocephalic and atraumatic.      Mouth/Throat:      Comments: No mandibular tenderness Eyes:     Pupils: Pupils are equal, round, and reactive to light.  Neck:     Musculoskeletal: Normal range of motion and neck supple. No neck rigidity or muscular tenderness.  Cardiovascular:     Rate and Rhythm: Normal rate and regular rhythm.     Heart sounds: Normal heart sounds. No murmur. No friction rub.  Pulmonary:     Effort: Pulmonary effort is normal.     Breath sounds: Normal breath sounds. No wheezing or rales.  Abdominal:     General: Bowel sounds are normal. There is no distension.     Palpations: Abdomen is soft.     Tenderness: There is no abdominal tenderness. There is no guarding or rebound.  Musculoskeletal:        General: Tenderness, deformity and signs of injury present.     Right shoulder: Normal.     Right wrist: She exhibits decreased range of motion, tenderness, bony tenderness and deformity.     Right knee: She exhibits decreased range of motion. She exhibits no bony tenderness.       Arms:       Legs:     Comments: No edema  Skin:    General: Skin is warm and dry.     Findings: No rash.  Neurological:     General: No focal deficit present.     Mental Status: She is alert and oriented to person, place, and time. Mental status is at baseline.     Cranial Nerves: No cranial nerve deficit.  Psychiatric:        Behavior: Behavior normal.      ED Treatments / Results  Labs (all labs ordered are listed, but only abnormal results are displayed) Labs Reviewed - No data to display  EKG None  Radiology Dg Shoulder  Right  Result Date: 10/21/2018 CLINICAL DATA:  Post fall now with right upper extremity pain and obvious deformity involving the right wrist EXAM: RIGHT SHOULDER - 2+ VIEW COMPARISON:  None. FINDINGS: No fracture or dislocation. Common clavicular joint spaces appear preserved. Glenohumeral joint spaces suboptimally evaluated due to obliquity. No  definite evidence of calcific tendinitis. Regional soft tissues appear normal. Limited visualization adjacent thorax is normal. IMPRESSION: No fracture or dislocation. Electronically Signed   By: Simonne Come M.D.   On: 10/21/2018 13:53   Dg Forearm Right  Result Date: 10/21/2018 CLINICAL DATA:  Post fall onto concrete, now with obvious wrist deformity. EXAM: RIGHT FOREARM - 2 VIEW COMPARISON:  None FINDINGS: Comminuted, displaced fractures involving the distal metadiaphysis of the radius and ulna with foreshortening and angulation, apex dorsal. The distal radial fracture extends to the distal radial carpal joint. No definite dislocation. Expected large amount of adjacent subcutaneous edema. No radiopaque body. IMPRESSION: Comminuted, displaced fractures involving the distal metaphysis the radius and ulna with extension of the distal radial fracture to the radiocarpal joint. Electronically Signed   By: Simonne Come M.D.   On: 10/21/2018 13:56    Procedures Procedures (including critical care time)  Medications Ordered in ED Medications  0.9 %  sodium chloride infusion ( Intravenous Stopped 10/21/18 1504)  HYDROmorphone (DILAUDID) injection 1 mg (1 mg Intravenous Given 10/21/18 1243)  ondansetron (ZOFRAN) injection 4 mg (4 mg Intravenous Given 10/21/18 1242)  Tdap (BOOSTRIX) injection 0.5 mL (0.5 mLs Intramuscular Given 10/21/18 1245)  ceFAZolin (ANCEF) IVPB 1 g/50 mL premix (0 g Intravenous Stopped 10/21/18 1504)  metoCLOPramide (REGLAN) injection 10 mg (10 mg Intravenous Given 10/21/18 1350)     Initial Impression / Assessment and Plan / ED Course  I have  reviewed the triage vital signs and the nursing notes.  Pertinent labs & imaging results that were available during my care of the patient were reviewed by me and considered in my medical decision making (see chart for details).     Patient presenting after tripping and falling on an outstretched hand with concern for an open fracture.  Patient has a puncture wound and significant deformity of the right wrist.  X-ray shows a comminuted displaced fracture involving the distal metaphysis of the radius and ulna with extension of the distal radius fracture to the joint.  Patient is currently neurovascularly intact with a good radial pulse and normal sensation in the hands.  Patient was given Ancef and tetanus shot was updated.  After 1 dose of pain medicine she is more comfortable.  X-ray of the shoulder is normal.  She was splinted in a position of comfort.  Spoke with Dr. Eulah Pont who will see patient at [facility], ER and take her to the OR.  She has been n.p.o. since 830 this morning and knows not to eat.  Final Clinical Impressions(s) / ED Diagnoses   Final diagnoses:  Other type I or II open intra-articular fracture of distal end of right radius, initial encounter    ED Discharge Orders    None       Gwyneth Sprout, MD 10/21/18 1540

## 2018-10-21 NOTE — Anesthesia Preprocedure Evaluation (Signed)
Anesthesia Evaluation  Patient identified by MRN, date of birth, ID band Patient awake    Reviewed: Allergy & Precautions, NPO status , Patient's Chart, lab work & pertinent test results  Airway Mallampati: II  TM Distance: >3 FB Neck ROM: Full    Dental  (+) Teeth Intact, Dental Advisory Given   Pulmonary    breath sounds clear to auscultation       Cardiovascular hypertension,  Rhythm:Regular Rate:Normal     Neuro/Psych    GI/Hepatic   Endo/Other    Renal/GU      Musculoskeletal   Abdominal   Peds  Hematology   Anesthesia Other Findings   Reproductive/Obstetrics                             Anesthesia Physical Anesthesia Plan  ASA: I  Anesthesia Plan: MAC and Regional   Post-op Pain Management:    Induction: Intravenous  PONV Risk Score and Plan: Ondansetron and Dexamethasone  Airway Management Planned: Simple Face Mask and Natural Airway  Additional Equipment:   Intra-op Plan:   Post-operative Plan:   Informed Consent: I have reviewed the patients History and Physical, chart, labs and discussed the procedure including the risks, benefits and alternatives for the proposed anesthesia with the patient or authorized representative who has indicated his/her understanding and acceptance.     Dental advisory given  Plan Discussed with: CRNA and Anesthesiologist  Anesthesia Plan Comments:         Anesthesia Quick Evaluation

## 2018-10-21 NOTE — ED Notes (Signed)
Spoke with Asher Muir, Press photographer.  Informed her that patient is transferring to her Department and Dr. Eulah Pont is the accepting physician.  Patient will be transported via POV.

## 2018-10-21 NOTE — Anesthesia Procedure Notes (Signed)
Anesthesia Regional Block: Supraclavicular block   Pre-Anesthetic Checklist: ,, timeout performed, Correct Patient, Correct Site, Correct Laterality, Correct Procedure, Correct Position, site marked, Risks and benefits discussed,  Surgical consent,  Pre-op evaluation,  At surgeon's request and post-op pain management  Laterality: Right  Prep: chloraprep       Needles:  Injection technique: Single-shot  Needle Type: Stimulator Needle - 80      Needle Gauge: 22     Additional Needles:   Procedures:,,,, ultrasound used (permanent image in chart),,,,  Narrative:  Start time: 10/21/2018 4:50 PM End time: 10/21/2018 5:55 PM Injection made incrementally with aspirations every 5 mL.  Performed by: Personally   Additional Notes: 20 cc 0.75% Ropivacaine injected easily

## 2018-10-21 NOTE — Discharge Instructions (Signed)
Keep wrist elevated at all times above your heart to reduce pain and swelling.  If needed, you may increase breakthrough pain medication (oxycodone) for the first few days post op to 2 tablets every 4 hours.  Stop as needed pain medication as soon as you are able.  Diet: As you were doing prior to hospitalization   Shower:  You have a splint on, leave the splint in place and keep the splint dry with a plastic bag.  Dressing:  You have a splint - leave the splint in place and we will change your bandages during your first follow-up appointment.    Activity:  Increase activity slowly as tolerated, but follow the weight bearing instructions below.  The rules on driving is that you can not be taking narcotics while you drive, and you must feel in control of the vehicle.    Weight Bearing:  Non weight bearing affected wrist.  Wear Sling for comfort when walking.   To prevent constipation: you may use a stool softener such as -  Colace (over the counter) 100 mg by mouth twice a day  Drink plenty of fluids (prune juice may be helpful) and high fiber foods Miralax (over the counter) for constipation as needed.    Itching:  If you experience itching with your medications, try taking only a single pain pill, or even half a pain pill at a time.  You can also use benadryl over the counter for itching or also to help with sleep.   Precautions:  If you experience chest pain or shortness of breath - call 911 immediately for transfer to the hospital emergency department!!  If you develop a fever greater that 101 F, purulent drainage from wound, increased redness or drainage from wound, or calf pain -- Call the office at [phone]                                         Follow- Up Appointment:  Please call for an appointment to be seen in 1 week 10/26/18  [place] - [phone]

## 2018-10-21 NOTE — ED Notes (Signed)
ED Provider at bedside. 

## 2018-10-21 NOTE — Anesthesia Postprocedure Evaluation (Signed)
Anesthesia Post Note  Patient: Kerri Allen  Procedure(s) Performed: OPEN REDUCTION INTERNAL FIXATION (ORIF) RIGHT OPEN DISTAL RADIUS/ULNA FRACTURE FRACTURE (Right Wrist)     Patient location during evaluation: PACU Anesthesia Type: Regional and MAC Level of consciousness: awake and alert Pain management: pain level controlled Vital Signs Assessment: post-procedure vital signs reviewed and stable Respiratory status: spontaneous breathing, nonlabored ventilation, respiratory function stable and patient connected to nasal cannula oxygen Cardiovascular status: stable and blood pressure returned to baseline Postop Assessment: no apparent nausea or vomiting Anesthetic complications: no    Last Vitals:  Vitals:   10/21/18 1945 10/21/18 1948  BP:  (!) 148/78  Pulse: 77 76  Resp: (!) 25 (!) 21  Temp:  36.5 C  SpO2: 100% 100%    Last Pain:  Vitals:   10/21/18 1948  TempSrc:   PainSc: 0-No pain                 Kerri Allen COKER

## 2018-10-21 NOTE — Transfer of Care (Signed)
Immediate Anesthesia Transfer of Care Note  Patient: Kerri Allen  Procedure(s) Performed: OPEN REDUCTION INTERNAL FIXATION (ORIF) RIGHT OPEN DISTAL RADIUS/ULNA FRACTURE FRACTURE (Right Wrist)  Patient Location: PACU  Anesthesia Type:MAC and Regional  Level of Consciousness: awake, alert  and oriented  Airway & Oxygen Therapy: Patient Spontanous Breathing  Post-op Assessment: Report given to RN and Post -op Vital signs reviewed and stable  Post vital signs: Reviewed and stable  Last Vitals:  Vitals Value Taken Time  BP 133/70 10/21/2018  6:54 PM  Temp    Pulse 81 10/21/2018  6:54 PM  Resp 12 10/21/2018  6:54 PM  SpO2 96 % 10/21/2018  6:54 PM  Vitals shown include unvalidated device data.  Last Pain:  Vitals:   10/21/18 1415  TempSrc:   PainSc: 6          Complications: No apparent anesthesia complications

## 2018-10-21 NOTE — Anesthesia Procedure Notes (Signed)
Procedure Name: MAC Date/Time: 10/21/2018 5:09 PM Performed by: Babs Bertin, CRNA Pre-anesthesia Checklist: Patient identified, Emergency Drugs available, Suction available, Patient being monitored and Timeout performed Patient Re-evaluated:Patient Re-evaluated prior to induction Oxygen Delivery Method: Nasal cannula

## 2018-10-21 NOTE — H&P (Signed)
ORTHOPAEDIC CONSULTATION  REQUESTING PHYSICIAN: No att. providers found  Chief Complaint: right wrist fracture  HPI: I have reviewed and agree with the below history: Patient is a healthy 63 year old female who only takes medication for hypothyroid disease presenting today after she fell and hurt her arm.  Patient states she was running with her dog when she did not notice a speed bump and tripped over it falling face first onto the concrete.  She has significant pain in her right wrist with bleeding but has normal sensation in her hand.  She is also complaining of some pain in her knee and on the inside of the right side of her mouth.  She denies any dental injury or neck pain.  She did not lose consciousness and denies history of anticoagulation use.  Past Medical History:  Diagnosis Date  . Arthritis   . Hypertension   . Thyroid disease    History reviewed. No pertinent surgical history. Social History   Socioeconomic History  . Marital status: Widowed    Spouse name: Not on file  . Number of children: Not on file  . Years of education: Not on file  . Highest education level: Not on file  Occupational History  . Not on file  Social Needs  . Financial resource strain: Not on file  . Food insecurity:    Worry: Not on file    Inability: Not on file  . Transportation needs:    Medical: Not on file    Non-medical: Not on file  Tobacco Use  . Smoking status: Never Smoker  . Smokeless tobacco: Never Used  Substance and Sexual Activity  . Alcohol use: Not on file  . Drug use: Not on file  . Sexual activity: Not on file  Lifestyle  . Physical activity:    Days per week: Not on file    Minutes per session: Not on file  . Stress: Not on file  Relationships  . Social connections:    Talks on phone: Not on file    Gets together: Not on file    Attends religious service: Not on file    Active member of club or organization: Not on file    Attends meetings of clubs or  organizations: Not on file    Relationship status: Not on file  Other Topics Concern  . Not on file  Social History Narrative  . Not on file   Family History  Problem Relation Age of Onset  . Parkinson's disease Mother    No Known Allergies Prior to Admission medications   Medication Sig Start Date End Date Taking? Authorizing Provider  Acetaminophen (TYLENOL ARTHRITIS PAIN PO) Take by mouth 2 (two) times daily.   Yes [provider]  carbonyl iron (EQL CARBONYL IRON) 45 MG TABS tablet  06/05/17  Yes [provider]  cholecalciferol (VITAMIN D3) 25 MCG (1000 UT) tablet Take 2,000 Units by mouth 3 (three) times a week.   Yes [provider]  LACTASE ENZYME PO Take 5 mg by mouth daily.   Yes [provider]  Lactobacillus-Inulin ([place]) CAPS  09/05/17  Yes [provider]  Omeprazole 20 MG TBDD  09/05/17  Yes [provider]  thiamine (VITAMIN B-1) 100 MG tablet Take 100 mg by mouth daily.   Yes [provider]  clindamycin (CLEOCIN T) 1 % lotion Apply topically 2 (two) times daily. 07/20/16   Hoyt Koch, MD  triamcinolone cream (KENALOG)  0.1 % Apply 1 application topically 2 (two) times daily. 08/17/18   Hoyt Koch, MD   Dg Shoulder Right  Result Date: 10/21/2018 CLINICAL DATA:  Post fall now with right upper extremity pain and obvious deformity involving the right wrist EXAM: RIGHT SHOULDER - 2+ VIEW COMPARISON:  None. FINDINGS: No fracture or dislocation. Common clavicular joint spaces appear preserved. Glenohumeral joint spaces suboptimally evaluated due to obliquity. No definite evidence of calcific tendinitis. Regional soft tissues appear normal. Limited visualization adjacent thorax is normal. IMPRESSION: No fracture or dislocation. Electronically Signed   By: Sandi Mariscal M.D.   On: 10/21/2018 13:53   Dg Forearm Right  Result Date: 10/21/2018 CLINICAL DATA:  Post fall onto  concrete, now with obvious wrist deformity. EXAM: RIGHT FOREARM - 2 VIEW COMPARISON:  None FINDINGS: Comminuted, displaced fractures involving the distal metadiaphysis of the radius and ulna with foreshortening and angulation, apex dorsal. The distal radial fracture extends to the distal radial carpal joint. No definite dislocation. Expected large amount of adjacent subcutaneous edema. No radiopaque body. IMPRESSION: Comminuted, displaced fractures involving the distal metaphysis the radius and ulna with extension of the distal radial fracture to the radiocarpal joint. Electronically Signed   By: Sandi Mariscal M.D.   On: 10/21/2018 13:56    Positive ROS: All other systems have been reviewed and were otherwise negative with the exception of those mentioned in the HPI and as above.  Labs cbc No results for input(s): WBC, HGB, HCT, PLT in the last 72 hours.  Labs inflam No results for input(s): CRP in the last 72 hours.  Invalid input(s): ESR  Labs coag No results for input(s): INR, PTT in the last 72 hours.  Invalid input(s): PT  No results for input(s): NA, K, CL, CO2, GLUCOSE, BUN, CREATININE, CALCIUM in the last 72 hours.  Physical Exam: Vitals:   10/21/18 1253 10/21/18 1454  BP: (!) 158/88 (!) 158/91  Pulse: 74 68  Resp: 16 18  Temp: 97.7 F (36.5 C)   SpO2: 100% 100%   General: Alert, no acute distress Cardiovascular: No pedal edema Respiratory: No cyanosis, no use of accessory musculature GI: No organomegaly, abdomen is soft and non-tender Skin: No lesions in the area of chief complaint other than those listed below in MSK exam.  Neurologic: Sensation intact distally save for the below mentioned MSK exam Psychiatric: Patient is competent for consent with normal mood and affect Lymphatic: No axillary or cervical lymphadenopathy  MUSCULOSKELETAL:  RUE: intact EPL/FPL/IO muscle function, NVI to M/R/U nerves.  Other extremities are atraumatic with painless ROM and  NVI.  Assessment: Open distal radius fracture, Ulna fracture  Plan: OR now for I&D, ORIF of radius and ulna   Renette Butters, MD Cell [phone]   10/21/2018 4:44 PM

## 2018-10-21 NOTE — ED Notes (Signed)
Right forearm is still bleeding slightly.

## 2018-10-22 NOTE — Discharge Summary (Signed)
Discharge Summary  Patient ID: Kerri Allen MRN: [id number] DOB/AGE: [birth year] 62 y.o.  Admit date: 10/21/2018 Discharge date: 10/22/2018  Admission Diagnoses:  Open fracture of right distal radius and ulna  Discharge Diagnoses:  Principal Problem:   Open fracture of right distal radius and ulna   Past Medical History:  Diagnosis Date  . Arthritis   . Hypertension   . Thyroid disease     Surgeries: Procedure(s): OPEN REDUCTION INTERNAL FIXATION (ORIF) RIGHT OPEN DISTAL RADIUS/ULNA FRACTURE FRACTURE on 10/21/2018   Consultants (if any): Treatment Team:  Sheral Apley, MD  Discharged Condition: Improved  Hospital Course: Kerri Allen is an 63 y.o. female who was admitted 10/21/2018 with a diagnosis of Open fracture of right distal radius and ulna and went to the operating room on 10/21/2018 and underwent the above named procedures.    She was given perioperative antibiotics:  Anti-infectives (From admission, onward)   Start     Dose/Rate Route Frequency Ordered Stop   10/21/18 1345  ceFAZolin (ANCEF) IVPB 1 g/50 mL premix     1 g 100 mL/hr over 30 Minutes Intravenous  Once 10/21/18 1338 10/21/18 1504   10/21/18 0000  cephALEXin (KEFLEX) 500 MG capsule     500 mg Oral 4 times daily 10/21/18 1901 10/31/18 2359    .  She was given sequential compression devices, early ambulation, for DVT prophylaxis.  She benefited maximally from the hospital stay and there were no complications.  She remained stable and was discharged POD0 at her request.  Recent vital signs:  Vitals:   10/21/18 1945 10/21/18 1948  BP:  (!) 148/78  Pulse: 77 76  Resp: (!) 25 (!) 21  Temp:  97.7 F (36.5 C)  SpO2: 100% 100%    Recent laboratory studies:  Lab Results  Component Value Date   HGB 14.8 07/23/2018   HGB 13.8 07/21/2017   HGB 13.4 07/20/2016   Lab Results  Component Value Date   WBC 8.3 07/23/2018   PLT 281.0 07/23/2018   No results found for: INR Lab Results   Component Value Date   NA 137 07/23/2018   K 4.8 07/23/2018   CL 103 07/23/2018   CO2 26 07/23/2018   BUN 11 07/23/2018   CREATININE 0.87 07/23/2018   GLUCOSE 93 07/23/2018    Discharge Medications:   Allergies as of 10/21/2018   No Known Allergies     Medication List    STOP taking these medications   TYLENOL ARTHRITIS PAIN PO Replaced by:  acetaminophen 500 MG tablet     TAKE these medications   acetaminophen 500 MG tablet Commonly known as:  TYLENOL Take 2 tablets (1,000 mg total) by mouth every 8 (eight) hours for 10 days. For Pain. Replaces:  TYLENOL ARTHRITIS PAIN PO   celecoxib 200 MG capsule Commonly known as:  CELEBREX Take 1 capsule (200 mg total) by mouth 2 (two) times daily for 14 days. For 2 weeks post op for pain and inflammation.  Discontinue Ibuprofen or other Anti-inflammatory medicine when taking this medicine.   cephALEXin 500 MG capsule Commonly known as:  KEFLEX Take 1 capsule (500 mg total) by mouth 4 (four) times daily for 10 days.   cholecalciferol 25 MCG (1000 UT) tablet Commonly known as:  VITAMIN D3 Take 2,000 Units by mouth 3 (three) times a week.   clindamycin 1 % lotion Commonly known as:  CLEOCIN T Apply topically 2 (two) times daily.   CULTURELLE DIGESTIVE HEALTH Caps  docusate sodium 100 MG capsule Commonly known as:  COLACE Take 1 capsule (100 mg total) by mouth 2 (two) times daily. To prevent constipation while taking pain medication.   EQL CARBONYL IRON 45 MG Tabs tablet Generic drug:  carbonyl iron   gabapentin 300 MG capsule Commonly known as:  NEURONTIN Take 1 capsule (300 mg total) by mouth 2 (two) times daily for 14 days. For 2 weeks post op for pain.   LACTASE ENZYME PO Take 5 mg by mouth daily.   methocarbamol 500 MG tablet Commonly known as:  ROBAXIN Take 1 tablet (500 mg total) by mouth every 8 (eight) hours as needed for muscle spasms.   Omeprazole 20 MG Tbdd   ondansetron 4 MG tablet Commonly known as:   ZOFRAN Take 1 tablet (4 mg total) by mouth every 8 (eight) hours as needed for nausea or vomiting.   oxyCODONE 5 MG immediate release tablet Commonly known as:  ROXICODONE Take 1 tablet (5 mg total) by mouth every 4 (four) hours as needed for up to 7 days for breakthrough pain.   thiamine 100 MG tablet Commonly known as:  VITAMIN B-1 Take 100 mg by mouth daily.   triamcinolone cream 0.1 % Commonly known as:  KENALOG Apply 1 application topically 2 (two) times daily.       Diagnostic Studies: Dg Shoulder Right  Result Date: 10/21/2018 CLINICAL DATA:  Post fall now with right upper extremity pain and obvious deformity involving the right wrist EXAM: RIGHT SHOULDER - 2+ VIEW COMPARISON:  None. FINDINGS: No fracture or dislocation. Common clavicular joint spaces appear preserved. Glenohumeral joint spaces suboptimally evaluated due to obliquity. No definite evidence of calcific tendinitis. Regional soft tissues appear normal. Limited visualization adjacent thorax is normal. IMPRESSION: No fracture or dislocation. Electronically Signed   By: Simonne Come M.D.   On: 10/21/2018 13:53   Dg Forearm Right  Result Date: 10/21/2018 CLINICAL DATA:  Post fall onto concrete, now with obvious wrist deformity. EXAM: RIGHT FOREARM - 2 VIEW COMPARISON:  None FINDINGS: Comminuted, displaced fractures involving the distal metadiaphysis of the radius and ulna with foreshortening and angulation, apex dorsal. The distal radial fracture extends to the distal radial carpal joint. No definite dislocation. Expected large amount of adjacent subcutaneous edema. No radiopaque body. IMPRESSION: Comminuted, displaced fractures involving the distal metaphysis the radius and ulna with extension of the distal radial fracture to the radiocarpal joint. Electronically Signed   By: Simonne Come M.D.   On: 10/21/2018 13:56    Disposition: Discharge disposition: 01-Home or Self Care       Discharge Instructions    Discharge  patient   Complete by:  As directed    Discharge disposition:  01-Home or Self Care   Discharge patient date:  10/21/2018      Follow-up Information    Sheral Apley, MD On 10/26/2018.   Specialty:  Orthopedic Surgery Contact information: [address] [place] [address] [phone]            Signed: Albina Billet III PA-C 10/22/2018, 11:03 AM

## 2018-10-24 ENCOUNTER — Encounter (HOSPITAL_COMMUNITY): Payer: Self-pay | Admitting: Orthopedic Surgery

## 2018-10-24 NOTE — Op Note (Signed)
10/21/2018  12:05 PM  PATIENT:  Kerri Allen    PRE-OPERATIVE DIAGNOSIS:  RIGHT OPEN DISTAL RADIUS/ULNA FRACTURES  POST-OPERATIVE DIAGNOSIS:  Same  PROCEDURE:  OPEN REDUCTION INTERNAL FIXATION (ORIF) RIGHT OPEN DISTAL RADIUS/ULNA FRACTURE FRACTURE  SURGEON:  Sheral Apley, MD  ASSISTANT: Aquilla Hacker, PA-C, he was present and scrubbed throughout the case, critical for completion in a timely fashion, and for retraction, instrumentation, and closure.   ANESTHESIA:   gen  PREOPERATIVE INDICATIONS:  Kerri Allen is a  63 y.o. female with a diagnosis of RIGHT OPEN DISTAL RADIUS/ULNA FRACTURES who failed conservative measures and elected for surgical management.    The risks benefits and alternatives were discussed with the patient preoperatively including but not limited to the risks of infection, bleeding, nerve injury, cardiopulmonary complications, the need for revision surgery, among others, and the patient was willing to proceed.  OPERATIVE IMPLANTS: biomet DVR plate and ulna plate  OPERATIVE FINDINGS: unstable fractures  BLOOD LOSS: min  COMPLICATIONS: none  TOURNIQUET TIME:  OPERATIVE PROCEDURE:  Patient was identified in the preoperative holding area and site was marked by me She was transported to the operating theater and placed on the table in supine position taking care to pad all bony prominences. After a preincinduction time out anesthesia was induced. The right upper extremity was prepped and draped in normal sterile fashion and a pre-incision timeout was performed. She received ancef for preoperative antibiotics.   The tourniquet was inflated.  I first examined her dorsal wound this was a penetration inside out from the ulnar fracture.  I extended this wound proximally and distally to allow for thorough irrigation and debridement of any devitalized tissue including skin-muscle bone.  I used a rondure for this.  I then did a thorough irrigation there  was no gross contamination.  I then turned my attention to her distal radius I made a volar approach to her wrist protecting her neurovascular structures traversing her FCR tendon.  I elevated the pronator off of her radius and identified her fracture site she had good cortical keys I was able to reduce this and hold it in place with a DVR plate I placed pegs in the articular portion of the plate.  I was careful to confirm no intra-articular penetration I then reduced the plate to the shaft and was very happy with the alignment on multiple fluoroscopic views.  I placed 3 shaft screws and again was very happy with the alignment and placement of all hardware.  I thoroughly irrigated and closed this incision.   I then turned my attention to the ulna I made a direct ulnar approach to this.  Identified her fracture site it had been reduced with the reduction of the radius.  I placed locking screws in the articular portion again confirming the articular penetration and then placed 3 good shaft screws distally proximally.  After fixing the ulnar shaft I then stressed the DRUJ and it was stable I thoroughly irrigated this incision and closed it in layers.  I closed her dorsal incision sterile dressings were applied a short arm splint she was awoken and taken the PACU in stable condition  POST OPERATIVE PLAN: Mobilize for DVT prophylaxis elevate splint for a week wound check next week

## 2018-10-29 DIAGNOSIS — S52251D Displaced comminuted fracture of shaft of ulna, right arm, subsequent encounter for closed fracture with routine healing: Secondary | ICD-10-CM | POA: Diagnosis not present

## 2018-10-29 DIAGNOSIS — S52501D Unspecified fracture of the lower end of right radius, subsequent encounter for closed fracture with routine healing: Secondary | ICD-10-CM | POA: Diagnosis not present

## 2018-11-02 DIAGNOSIS — S52251D Displaced comminuted fracture of shaft of ulna, right arm, subsequent encounter for closed fracture with routine healing: Secondary | ICD-10-CM | POA: Diagnosis not present

## 2018-12-03 DIAGNOSIS — S52251D Displaced comminuted fracture of shaft of ulna, right arm, subsequent encounter for closed fracture with routine healing: Secondary | ICD-10-CM | POA: Diagnosis not present

## 2019-01-14 DIAGNOSIS — S52251D Displaced comminuted fracture of shaft of ulna, right arm, subsequent encounter for closed fracture with routine healing: Secondary | ICD-10-CM | POA: Diagnosis not present

## 2019-07-25 ENCOUNTER — Encounter: Payer: BLUE CROSS/BLUE SHIELD | Admitting: Internal Medicine

## 2019-07-26 ENCOUNTER — Encounter: Payer: Self-pay | Admitting: Internal Medicine

## 2019-07-26 ENCOUNTER — Ambulatory Visit (INDEPENDENT_AMBULATORY_CARE_PROVIDER_SITE_OTHER): Payer: BC Managed Care – PPO | Admitting: Internal Medicine

## 2019-07-26 ENCOUNTER — Other Ambulatory Visit (INDEPENDENT_AMBULATORY_CARE_PROVIDER_SITE_OTHER): Payer: BC Managed Care – PPO

## 2019-07-26 ENCOUNTER — Other Ambulatory Visit: Payer: Self-pay

## 2019-07-26 ENCOUNTER — Ambulatory Visit (INDEPENDENT_AMBULATORY_CARE_PROVIDER_SITE_OTHER)
Admission: RE | Admit: 2019-07-26 | Discharge: 2019-07-26 | Disposition: A | Discharge status: Home / Self Care | Payer: BC Managed Care – PPO | Source: Ambulatory Visit | Attending: Internal Medicine | Admitting: Internal Medicine

## 2019-07-26 VITALS — BP 130/90 | HR 80 | Temp 97.7°F | Ht 66.0 in | Wt 161.0 lb

## 2019-07-26 DIAGNOSIS — Z0000 Encounter for general adult medical examination without abnormal findings: Secondary | ICD-10-CM | POA: Diagnosis not present

## 2019-07-26 DIAGNOSIS — S52501S Unspecified fracture of the lower end of right radius, sequela: Secondary | ICD-10-CM

## 2019-07-26 DIAGNOSIS — S52601S Unspecified fracture of lower end of right ulna, sequela: Secondary | ICD-10-CM

## 2019-07-26 DIAGNOSIS — Z23 Encounter for immunization: Secondary | ICD-10-CM

## 2019-07-26 LAB — VITAMIN D 25 HYDROXY (VIT D DEFICIENCY, FRACTURES): VITD: 37.06 ng/mL (ref 30.00–100.00)

## 2019-07-26 LAB — LIPID PANEL
Cholesterol: 223 mg/dL — ABNORMAL HIGH (ref 0–200)
HDL: 49 mg/dL (ref >39.00)
LDL Cholesterol: 141 mg/dL — ABNORMAL HIGH (ref 0–99)
NonHDL: 173.55
Total CHOL/HDL Ratio: 5
Triglycerides: 162 mg/dL — ABNORMAL HIGH (ref 0.0–149.0)
VLDL: 32.4 mg/dL (ref 0.0–40.0)

## 2019-07-26 LAB — COMPREHENSIVE METABOLIC PANEL
ALT: 17 U/L (ref 0–35)
AST: 18 U/L (ref 0–37)
Albumin: 4.1 g/dL (ref 3.5–5.2)
Alkaline Phosphatase: 71 U/L (ref 39–117)
BUN: 13 mg/dL (ref 6–23)
CO2: 28 mEq/L (ref 19–32)
Calcium: 9.5 mg/dL (ref 8.4–10.5)
Chloride: 101 mEq/L (ref 96–112)
Creatinine, Ser: 0.9 mg/dL (ref 0.40–1.20)
GFR: 63.45 mL/min (ref >60.00)
Glucose, Bld: 98 mg/dL (ref 70–99)
Potassium: 4.3 mEq/L (ref 3.5–5.1)
Sodium: 136 mEq/L (ref 135–145)
Total Bilirubin: 0.5 mg/dL (ref 0.2–1.2)
Total Protein: 7.4 g/dL (ref 6.0–8.3)

## 2019-07-26 LAB — CBC
HCT: 42.9 % (ref 36.0–46.0)
Hemoglobin: 14.5 g/dL (ref 12.0–15.0)
MCHC: 33.9 g/dL (ref 30.0–36.0)
MCV: 93.6 fl (ref 78.0–100.0)
Platelets: 288 10*3/uL (ref 150.0–400.0)
RBC: 4.58 Mil/uL (ref 3.87–5.11)
RDW: 12.7 % (ref 11.5–15.5)
WBC: 7.6 10*3/uL (ref 4.0–10.5)

## 2019-07-26 NOTE — Patient Instructions (Signed)
Health Maintenance, Female Adopting a healthy lifestyle and getting preventive care are important in promoting health and wellness. Ask your health care provider about:  The right schedule for you to have regular tests and exams.  Things you can do on your own to prevent diseases and keep yourself healthy. What should I know about diet, weight, and exercise? Eat a healthy diet   Eat a diet that includes plenty of vegetables, fruits, low-fat dairy products, and lean protein.  Do not eat a lot of foods that are high in solid fats, added sugars, or sodium. Maintain a healthy weight Body mass index (BMI) is used to identify weight problems. It estimates body fat based on height and weight. Your health care provider can help determine your BMI and help you achieve or maintain a healthy weight. Get regular exercise Get regular exercise. This is one of the most important things you can do for your health. Most adults should:  Exercise for at least 150 minutes each week. The exercise should increase your heart rate and make you sweat (moderate-intensity exercise).  Do strengthening exercises at least twice a week. This is in addition to the moderate-intensity exercise.  Spend less time sitting. Even light physical activity can be beneficial. Watch cholesterol and blood lipids Have your blood tested for lipids and cholesterol at 62 years of age, then have this test every 5 years. Have your cholesterol levels checked more often if:  Your lipid or cholesterol levels are high.  You are older than 62 years of age.  You are at high risk for heart disease. What should I know about cancer screening? Depending on your health history and family history, you may need to have cancer screening at various ages. This may include screening for:  Breast cancer.  Cervical cancer.  Colorectal cancer.  Skin cancer.  Lung cancer. What should I know about heart disease, diabetes, and high blood  pressure? Blood pressure and heart disease  High blood pressure causes heart disease and increases the risk of stroke. This is more likely to develop in people who have high blood pressure readings, are of African descent, or are overweight.  Have your blood pressure checked: ? Every 3-5 years if you are 18-39 years of age. ? Every year if you are 40 years old or older. Diabetes Have regular diabetes screenings. This checks your fasting blood sugar level. Have the screening done:  Once every three years after age 40 if you are at a normal weight and have a low risk for diabetes.  More often and at a younger age if you are overweight or have a high risk for diabetes. What should I know about preventing infection? Hepatitis B If you have a higher risk for hepatitis B, you should be screened for this virus. Talk with your health care provider to find out if you are at risk for hepatitis B infection. Hepatitis C Testing is recommended for:  Everyone born from 1945 through 1965.  Anyone with known risk factors for hepatitis C. Sexually transmitted infections (STIs)  Get screened for STIs, including gonorrhea and chlamydia, if: ? You are sexually active and are younger than 62 years of age. ? You are older than 62 years of age and your health care provider tells you that you are at risk for this type of infection. ? Your sexual activity has changed since you were last screened, and you are at increased risk for chlamydia or gonorrhea. Ask your health care provider if   you are at risk.  Ask your health care provider about whether you are at high risk for HIV. Your health care provider may recommend a prescription medicine to help prevent HIV infection. If you choose to take medicine to prevent HIV, you should first get tested for HIV. You should then be tested every 3 months for as long as you are taking the medicine. Pregnancy  If you are about to stop having your period (premenopausal) and  you may become pregnant, seek counseling before you get pregnant.  Take 400 to 800 micrograms (mcg) of folic acid every day if you become pregnant.  Ask for birth control (contraception) if you want to prevent pregnancy. Osteoporosis and menopause Osteoporosis is a disease in which the bones lose minerals and strength with aging. This can result in bone fractures. If you are 65 years old or older, or if you are at risk for osteoporosis and fractures, ask your health care provider if you should:  Be screened for bone loss.  Take a calcium or vitamin D supplement to lower your risk of fractures.  Be given hormone replacement therapy (HRT) to treat symptoms of menopause. Follow these instructions at home: Lifestyle  Do not use any products that contain nicotine or tobacco, such as cigarettes, e-cigarettes, and chewing tobacco. If you need help quitting, ask your health care provider.  Do not use street drugs.  Do not share needles.  Ask your health care provider for help if you need support or information about quitting drugs. Alcohol use  Do not drink alcohol if: ? Your health care provider tells you not to drink. ? You are pregnant, may be pregnant, or are planning to become pregnant.  If you drink alcohol: ? Limit how much you use to 0-1 drink a day. ? Limit intake if you are breastfeeding.  Be aware of how much alcohol is in your drink. In the [place] one drink equals one 12 oz bottle of beer (355 mL), one 5 oz glass of wine (148 mL), or one 1 oz glass of hard liquor (44 mL). General instructions  Schedule regular health, dental, and eye exams.  Stay current with your vaccines.  Tell your health care provider if: ? You often feel depressed. ? You have ever been abused or do not feel safe at home. Summary  Adopting a healthy lifestyle and getting preventive care are important in promoting health and wellness.  Follow your health care provider's instructions about healthy  diet, exercising, and getting tested or screened for diseases.  Follow your health care provider's instructions on monitoring your cholesterol and blood pressure. This information is not intended to replace advice given to you by your health care provider. Make sure you discuss any questions you have with your health care provider. Document Released: 03/07/2011 Document Revised: 08/15/2018 Document Reviewed: 08/15/2018 Elsevier Patient Education  2020 [employer]  

## 2019-07-26 NOTE — Assessment & Plan Note (Signed)
Flu shot given today. Shingrix complete. Tetanus up to date. Colonoscopy declines. Mammogram ordered, pap smear declines and dexa ordered. Counseled about sun safety and mole surveillance. Counseled about the dangers of distracted driving. Given 10 year screening recommendations.

## 2019-07-26 NOTE — Assessment & Plan Note (Signed)
Recent fracture, healed well. Needs DEXA to assess bone strength.

## 2019-07-26 NOTE — Progress Notes (Signed)
   Subjective:   Patient ID: Kerri Allen, female    DOB: 29-Apr-1956, 63 y.o.   MRN: [id number]  HPI The patient is a 63 YO female coming in for physical.   PMH, [place], social history reviewed and updated  Review of Systems  Constitutional: Negative.   HENT: Negative.   Eyes: Negative.   Respiratory: Negative for cough, chest tightness and shortness of breath.   Cardiovascular: Negative for chest pain, palpitations and leg swelling.  Gastrointestinal: Negative for abdominal distention, abdominal pain, constipation, diarrhea, nausea and vomiting.  Musculoskeletal: Negative.   Skin: Negative.   Neurological: Negative.   Psychiatric/Behavioral: Negative.     Objective:  Physical Exam Constitutional:      Appearance: She is well-developed.  HENT:     Head: Normocephalic and atraumatic.  Neck:     Musculoskeletal: Normal range of motion.  Cardiovascular:     Rate and Rhythm: Normal rate and regular rhythm.  Pulmonary:     Effort: Pulmonary effort is normal. No respiratory distress.     Breath sounds: Normal breath sounds. No wheezing or rales.  Abdominal:     General: Bowel sounds are normal. There is no distension.     Palpations: Abdomen is soft.     Tenderness: There is no abdominal tenderness. There is no rebound.  Skin:    General: Skin is warm and dry.  Neurological:     Mental Status: She is alert and oriented to person, place, and time.     Coordination: Coordination normal.     Vitals:   07/26/19 0810  BP: 130/90  Pulse: 80  Temp: 97.7 F (36.5 C)  TempSrc: Oral  SpO2: 97%  Weight: 161 lb (73 kg)  Height: 5\' 6"  (1.676 m)   This visit occurred during the SARS-CoV-2 public health emergency.  Safety protocols were in place, including screening questions prior to the visit, additional usage of staff PPE, and extensive cleaning of exam room while observing appropriate contact time as indicated for disinfecting solutions.   Assessment & Plan:  Flu shot given  at visit

## 2019-08-06 ENCOUNTER — Ambulatory Visit
Admission: RE | Admit: 2019-08-06 | Discharge: 2019-08-06 | Disposition: A | Discharge status: Home / Self Care | Payer: BC Managed Care – PPO | Source: Ambulatory Visit | Attending: Internal Medicine | Admitting: Internal Medicine

## 2019-08-06 ENCOUNTER — Other Ambulatory Visit: Payer: Self-pay

## 2019-08-06 DIAGNOSIS — Z0000 Encounter for general adult medical examination without abnormal findings: Secondary | ICD-10-CM

## 2019-08-06 DIAGNOSIS — Z1231 Encounter for screening mammogram for malignant neoplasm of breast: Secondary | ICD-10-CM | POA: Diagnosis not present

## 2019-10-30 ENCOUNTER — Ambulatory Visit: Payer: BC Managed Care – PPO | Admitting: Internal Medicine

## 2019-11-01 ENCOUNTER — Ambulatory Visit: Payer: BC Managed Care – PPO | Admitting: Internal Medicine

## 2020-01-03 ENCOUNTER — Ambulatory Visit: Payer: BC Managed Care – PPO | Admitting: Family

## 2020-01-03 ENCOUNTER — Encounter: Payer: Self-pay | Admitting: Family

## 2020-01-03 ENCOUNTER — Other Ambulatory Visit: Payer: Self-pay

## 2020-01-03 VITALS — BP 138/84 | HR 74 | Temp 99.0°F | Ht 66.0 in | Wt 165.2 lb

## 2020-01-03 DIAGNOSIS — R300 Dysuria: Secondary | ICD-10-CM

## 2020-01-03 LAB — POC URINALSYSI DIPSTICK (AUTOMATED)
Bilirubin, UA: NEGATIVE
Blood, UA: POSITIVE
Glucose, UA: NEGATIVE
Ketones, UA: NEGATIVE
Nitrite, UA: NEGATIVE
Protein, UA: NEGATIVE
Spec Grav, UA: 1.01 (ref 1.010–1.025)
Urobilinogen, UA: 0.2 E.U./dL
pH, UA: 6.5 (ref 5.0–8.0)

## 2020-01-03 MED ORDER — SULFAMETHOXAZOLE-TRIMETHOPRIM 800-160 MG PO TABS: 1.0000 | ORAL_TABLET | Freq: Two times a day (BID) | ORAL | 0 refills | Status: DC

## 2020-01-03 NOTE — Addendum Note (Signed)
Addended by: Karma Ganja on: 01/03/2020 04:56 PM   Modules accepted: Orders

## 2020-01-03 NOTE — Progress Notes (Signed)
  Kerri Allen is a 64 y.o. female with the following history as recorded in [employer]:  Patient Active Problem List   Diagnosis Date Noted  . Open fracture of right distal radius and ulna 10/21/2018  . Routine general medical examination at a health care facility 07/22/2014    Current Outpatient Medications  Medication Sig Dispense Refill  . carbonyl iron (EQL CARBONYL IRON) 45 MG TABS tablet     . LACTASE ENZYME PO Take 5 mg by mouth daily.    . Lactobacillus-Inulin (CULTURELLE DIGESTIVE HEALTH) CAPS     . Omeprazole 20 MG TBDD     . thiamine (VITAMIN B-1) 100 MG tablet Take 100 mg by mouth daily.    Marland Kitchen triamcinolone cream (KENALOG) 0.1 % Apply 1 application topically 2 (two) times daily. 100 g 3  . cholecalciferol (VITAMIN D3) 25 MCG (1000 UT) tablet Take 2,000 Units by mouth 3 (three) times a week.    . sulfamethoxazole-trimethoprim (BACTRIM DS) 800-160 MG tablet Take 1 tablet by mouth 2 (two) times daily. 20 tablet 0   No current facility-administered medications for this visit.    Allergies: Patient has no known allergies.  Past Medical History:  Diagnosis Date  . Arthritis   . Hypertension   . Thyroid disease     Past Surgical History:  Procedure Laterality Date  . ORIF WRIST FRACTURE Right 10/21/2018   Procedure: OPEN REDUCTION INTERNAL FIXATION (ORIF) RIGHT OPEN DISTAL RADIUS/ULNA FRACTURE FRACTURE;  Surgeon: Sheral Apley, MD;  Location: [facility] OR;  Service: Orthopedics;  Laterality: Right;    Family History  Problem Relation Age of Onset  . Parkinson's disease Mother     Social History   Tobacco Use  . Smoking status: Never Smoker  . Smokeless tobacco: Never Used  Substance Use Topics  . Alcohol use: Not on file    Subjective:   Presents with concerns for UTI; is prone to recurrent UTIs; + burning, frequency; symptoms x 2-3 days; responds well to Bactrim and is asking for this medication if possible;     Objective:  Vitals:   01/03/20 1314  BP:  138/84  Pulse: 74  Temp: 99 F (37.2 C)  TempSrc: Oral  SpO2: 98%  Weight: 165 lb 3.2 oz (74.9 kg)  Height: 5\' 6"  (1.676 m)    General: Well developed, well nourished, in no acute distress  Skin : Warm and dry.  Head: Normocephalic and atraumatic  Lungs: Respirations unlabored;  Musculoskeletal: No deformities; no active joint inflammation  Extremities: No edema, cyanosis, clubbing  Vessels: Symmetric bilaterally  Neurologic: Alert and oriented; speech intact; face symmetrical; moves all extremities well; CNII-XII intact without focal deficit   Assessment:  1. Dysuria     Plan:  Suspect UTI; check U/A and urine culture; Rx for Bactrim DS bid x 5 days; increase fluids,rest and follow- up worse, no better;   No follow-ups on file.  Orders Placed This Encounter  Procedures  . Urine Culture    Requested Prescriptions   Signed Prescriptions Disp Refills  . sulfamethoxazole-trimethoprim (BACTRIM DS) 800-160 MG tablet 20 tablet 0    Sig: Take 1 tablet by mouth 2 (two) times daily.

## 2020-01-04 LAB — URINE CULTURE

## 2020-07-10 ENCOUNTER — Other Ambulatory Visit: Payer: Self-pay

## 2020-07-10 ENCOUNTER — Encounter: Payer: Self-pay | Admitting: Family

## 2020-07-10 ENCOUNTER — Ambulatory Visit: Payer: BC Managed Care – PPO | Admitting: Family

## 2020-07-10 VITALS — BP 148/80 | HR 102 | Temp 98.6°F | Ht 66.0 in | Wt 165.0 lb

## 2020-07-10 DIAGNOSIS — Z23 Encounter for immunization: Secondary | ICD-10-CM

## 2020-07-10 DIAGNOSIS — R300 Dysuria: Secondary | ICD-10-CM | POA: Diagnosis not present

## 2020-07-10 DIAGNOSIS — R3989 Other symptoms and signs involving the genitourinary system: Secondary | ICD-10-CM | POA: Diagnosis not present

## 2020-07-10 LAB — POCT URINALYSIS DIPSTICK
Bilirubin, UA: NEGATIVE
Blood, UA: POSITIVE
Glucose, UA: NEGATIVE
Ketones, UA: NEGATIVE
Nitrite, UA: NEGATIVE
Protein, UA: NEGATIVE
Spec Grav, UA: 1.015 (ref 1.010–1.025)
Urobilinogen, UA: 0.2 E.U./dL
pH, UA: 6 (ref 5.0–8.0)

## 2020-07-10 NOTE — Progress Notes (Signed)
Kerri Allen is a 64 y.o. female with the following history as recorded in Epic[facility]:  Patient Active Problem List   Diagnosis Date Noted  . Open fracture of right distal radius and ulna 10/21/2018  . Routine general medical examination at a health care facility 07/22/2014    Current Outpatient Medications  Medication Sig Dispense Refill  . esomeprazole (NEXIUM) 20 MG capsule Take 20 mg by mouth daily at 12 noon.    Marland Kitchen LACTASE ENZYME PO Take 5 mg by mouth daily.     . Multiple Vitamin (MULTIVITAMIN) tablet Take 1 tablet by mouth daily.    . carbonyl iron (EQL CARBONYL IRON) 45 MG TABS tablet  (Patient not taking: Reported on 07/10/2020)    . cholecalciferol (VITAMIN D3) 25 MCG (1000 UT) tablet Take 2,000 Units by mouth 3 (three) times a week. (Patient not taking: Reported on 07/10/2020)    . Lactobacillus-Inulin (CULTURELLE DIGESTIVE HEALTH) CAPS  (Patient not taking: Reported on 07/10/2020)    . Omeprazole 20 MG TBDD  (Patient not taking: Reported on 07/10/2020)    . sulfamethoxazole-trimethoprim (BACTRIM DS) 800-160 MG tablet Take 1 tablet by mouth 2 (two) times daily. (Patient not taking: Reported on 07/10/2020) 20 tablet 0  . thiamine (VITAMIN B-1) 100 MG tablet Take 100 mg by mouth daily. (Patient not taking: Reported on 07/10/2020)    . triamcinolone cream (KENALOG) 0.1 % Apply 1 application topically 2 (two) times daily. (Patient not taking: Reported on 07/10/2020) 100 g 3   No current facility-administered medications for this visit.    Allergies: Patient has no known allergies.  Past Medical History:  Diagnosis Date  . Arthritis   . Hypertension   . Thyroid disease     Past Surgical History:  Procedure Laterality Date  . ORIF WRIST FRACTURE Right 10/21/2018   Procedure: OPEN REDUCTION INTERNAL FIXATION (ORIF) RIGHT OPEN DISTAL RADIUS/ULNA FRACTURE FRACTURE;  Surgeon: Sheral Apley, MD;  Location: [facility] OR;  Service: Orthopedics;  Laterality: Right;    Family History   Problem Relation Age of Onset  . Parkinson's disease Mother     Social History   Tobacco Use  . Smoking status: Never Smoker  . Smokeless tobacco: Never Used  Substance Use Topics  . Alcohol use: Not on file    Subjective:  Patient is having bladder pain- trying to differentiate between UTI/ interstitial cystitis; symptoms present for 3-6 months- notes that the only thing that helps is Ibuprofen; patient notes her symptoms may have improved slightly after treatment in April of this year but never cleared; she has no blood in her urine/ no back pain/ no vaginal spotting;   Objective:  Vitals:   07/10/20 1430  BP: (!) 148/80  Pulse: (!) 102  Temp: 98.6 F (37 C)  TempSrc: Oral  SpO2: 97%  Weight: 165 lb (74.8 kg)  Height: 5\' 6"  (1.676 m)    General: Well developed, well nourished, in no acute distress  Skin : Warm and dry.  Head: Normocephalic and atraumatic  Lungs: Respirations unlabored;  Vessels: Symmetric bilaterally  Neurologic: Alert and oriented; speech intact; face symmetrical; moves all extremities well; CNII-XII intact without focal deficit   Assessment:  1. Dysuria   2. Bladder pain     Plan:  Due to chronicity of symptoms, patient is in agreement to see urologic specialist; she defers antibiotics until the urine culture from today comes back next week; she defers Pyridium; will update referral to urogynecology- if they cannot see her  in the next 1-2 weeks, I will plan to update some imaging in anticipation of that visit- at least pelvic ultrasound; follow up to be determined;  Flu shot given today; keep planned appt with PCP for CPE in next 2-3 months;   This visit occurred during the SARS-CoV-2 public health emergency.  Safety protocols were in place, including screening questions prior to the visit, additional usage of staff PPE, and extensive cleaning of exam room while observing appropriate contact time as indicated for disinfecting solutions.     No  follow-ups on file.  Orders Placed This Encounter  Procedures  . Urine Culture    Standing Status:   Future    Number of Occurrences:   1    Standing Expiration Date:   07/10/2021  . Ambulatory referral to Urogynecology    Referral Priority:   Routine    Referral Type:   Consultation    Referral Reason:   Specialty Services Required    Requested Specialty:   Urology    Number of Visits Requested:   1  . POCT urinalysis dipstick    Requested Prescriptions    No prescriptions requested or ordered in this encounter

## 2020-07-12 LAB — URINE CULTURE

## 2020-07-13 ENCOUNTER — Other Ambulatory Visit: Payer: Self-pay | Admitting: Family

## 2020-07-13 MED ORDER — SULFAMETHOXAZOLE-TRIMETHOPRIM 800-160 MG PO TABS: 1.0000 | ORAL_TABLET | Freq: Two times a day (BID) | ORAL | 0 refills | Status: DC

## 2020-07-20 ENCOUNTER — Other Ambulatory Visit: Payer: Self-pay | Admitting: Family

## 2020-07-21 ENCOUNTER — Telehealth: Payer: Self-pay | Admitting: Family

## 2020-07-21 NOTE — Telephone Encounter (Signed)
Patient states she believe the UTI is not always the gone and would like a refill on medication. She states it is about 85% better.  Please follow up with patient.

## 2020-07-21 NOTE — Telephone Encounter (Signed)
Should go to provider who treated.

## 2020-07-21 NOTE — Telephone Encounter (Signed)
Please advise 

## 2020-07-22 ENCOUNTER — Telehealth: Payer: Self-pay | Admitting: *Deleted

## 2020-07-22 MED ORDER — SULFAMETHOXAZOLE-TRIMETHOPRIM 800-160 MG PO TABS: 1.0000 | ORAL_TABLET | Freq: Two times a day (BID) | ORAL | 0 refills | Status: DC

## 2020-07-22 NOTE — Addendum Note (Signed)
Addended by: Eustace Moore on: 07/22/2020 09:49 AM   Modules accepted: Orders

## 2020-07-22 NOTE — Telephone Encounter (Signed)
Will extend the treatment; needs to schedule in office visit if symptoms persist after this treatment

## 2020-07-22 NOTE — Telephone Encounter (Signed)
Pt left VM message stating that she is returning a call from this office. She stated that she does have a referral from her PCP for appt however wants to put the referral on hold until January when she will have different and more beneficial insurance. She will call back in January to schedule the appointment. Scheduling staff for this office will be informed.

## 2020-08-31 ENCOUNTER — Encounter: Payer: BC Managed Care – PPO | Admitting: Internal Medicine

## 2020-09-14 ENCOUNTER — Ambulatory Visit (INDEPENDENT_AMBULATORY_CARE_PROVIDER_SITE_OTHER): Payer: BC Managed Care – PPO | Admitting: Internal Medicine

## 2020-09-14 ENCOUNTER — Other Ambulatory Visit: Payer: Self-pay

## 2020-09-14 ENCOUNTER — Encounter: Payer: Self-pay | Admitting: Internal Medicine

## 2020-09-14 VITALS — BP 134/80 | HR 92 | Temp 98.1°F | Ht 66.0 in | Wt 165.0 lb

## 2020-09-14 DIAGNOSIS — Z0000 Encounter for general adult medical examination without abnormal findings: Secondary | ICD-10-CM

## 2020-09-14 DIAGNOSIS — N390 Urinary tract infection, site not specified: Secondary | ICD-10-CM | POA: Insufficient documentation

## 2020-09-14 LAB — COMPREHENSIVE METABOLIC PANEL
ALT: 19 U/L (ref 0–35)
AST: 20 U/L (ref 0–37)
Albumin: 4.2 g/dL (ref 3.5–5.2)
Alkaline Phosphatase: 66 U/L (ref 39–117)
BUN: 13 mg/dL (ref 6–23)
CO2: 29 mEq/L (ref 19–32)
Calcium: 9.9 mg/dL (ref 8.4–10.5)
Chloride: 102 mEq/L (ref 96–112)
Creatinine, Ser: 1.12 mg/dL (ref 0.40–1.20)
GFR: 52.48 mL/min — ABNORMAL LOW (ref >60.00)
Glucose, Bld: 87 mg/dL (ref 70–99)
Potassium: 4.8 mEq/L (ref 3.5–5.1)
Sodium: 135 mEq/L (ref 135–145)
Total Bilirubin: 0.4 mg/dL (ref 0.2–1.2)
Total Protein: 7.5 g/dL (ref 6.0–8.3)

## 2020-09-14 LAB — CBC
HCT: 42.5 % (ref 36.0–46.0)
Hemoglobin: 14.6 g/dL (ref 12.0–15.0)
MCHC: 34.4 g/dL (ref 30.0–36.0)
MCV: 91 fl (ref 78.0–100.0)
Platelets: 284 10*3/uL (ref 150.0–400.0)
RBC: 4.67 Mil/uL (ref 3.87–5.11)
RDW: 12.6 % (ref 11.5–15.5)
WBC: 8.6 10*3/uL (ref 4.0–10.5)

## 2020-09-14 LAB — LIPID PANEL
Cholesterol: 214 mg/dL — ABNORMAL HIGH (ref 0–200)
HDL: 39.7 mg/dL (ref >39.00)
NonHDL: 174.73
Total CHOL/HDL Ratio: 5
Triglycerides: 282 mg/dL — ABNORMAL HIGH (ref 0.0–149.0)
VLDL: 56.4 mg/dL — ABNORMAL HIGH (ref 0.0–40.0)

## 2020-09-14 LAB — LDL CHOLESTEROL, DIRECT: Direct LDL: 134 mg/dL

## 2020-09-14 MED ORDER — TRIAMCINOLONE ACETONIDE 0.1 % EX CREA: 1.0000 "application " | TOPICAL_CREAM | Freq: Two times a day (BID) | CUTANEOUS | 3 refills | Status: DC

## 2020-09-14 MED ORDER — AMOXICILLIN 500 MG PO CAPS: 500.0000 mg | ORAL_CAPSULE | Freq: Every day | ORAL | 1 refills | Status: DC

## 2020-09-14 NOTE — Progress Notes (Signed)
   Subjective:   Patient ID: Kerri Allen, female    DOB: 11-22-1955, 65 y.o.   MRN: [id number]  HPI The patient is a 65 YO female coming in for physical. Walks 3 miles daily.   PMH, [facility], social history reviewed and updated  Review of Systems  Constitutional: Negative.   HENT: Negative.   Eyes: Negative.   Respiratory: Negative for cough, chest tightness and shortness of breath.   Cardiovascular: Negative for chest pain, palpitations and leg swelling.  Gastrointestinal: Negative for abdominal distention, abdominal pain, constipation, diarrhea, nausea and vomiting.  Musculoskeletal: Negative.   Skin: Negative.   Neurological: Negative.   Psychiatric/Behavioral: Negative.     Objective:  Physical Exam Constitutional:      Appearance: She is well-developed and well-nourished.  HENT:     Head: Normocephalic and atraumatic.  Eyes:     Extraocular Movements: EOM normal.  Cardiovascular:     Rate and Rhythm: Normal rate and regular rhythm.  Pulmonary:     Effort: Pulmonary effort is normal. No respiratory distress.     Breath sounds: Normal breath sounds. No wheezing or rales.  Abdominal:     General: Bowel sounds are normal. There is no distension.     Palpations: Abdomen is soft.     Tenderness: There is no abdominal tenderness. There is no rebound.  Musculoskeletal:        General: No edema.     Cervical back: Normal range of motion.  Skin:    General: Skin is warm and dry.  Neurological:     Mental Status: She is alert and oriented to person, place, and time.     Coordination: Coordination normal.  Psychiatric:        Mood and Affect: Mood and affect normal.     Vitals:   09/14/20 0812  BP: 134/80  Pulse: 92  Temp: 98.1 F (36.7 C)  TempSrc: Oral  SpO2: 95%  Weight: 165 lb (74.8 kg)  Height: 5\' 6"  (1.676 m)    This visit occurred during the SARS-CoV-2 public health emergency.  Safety protocols were in place, including screening questions prior to  the visit, additional usage of staff PPE, and extensive cleaning of exam room while observing appropriate contact time as indicated for disinfecting solutions.   Assessment & Plan:

## 2020-09-14 NOTE — Assessment & Plan Note (Signed)
Chronic problem, will rx amoxicillin daily dosing for 6 months and she will start azo to help.

## 2020-09-14 NOTE — Patient Instructions (Addendum)
We have sent in the amoxicillin to take daily.    Health Maintenance, Female Adopting a healthy lifestyle and getting preventive care are important in promoting health and wellness. Ask your health care provider about:  The right schedule for you to have regular tests and exams.  Things you can do on your own to prevent diseases and keep yourself healthy. What should I know about diet, weight, and exercise? Eat a healthy diet  Eat a diet that includes plenty of vegetables, fruits, low-fat dairy products, and lean protein.  Do not eat a lot of foods that are high in solid fats, added sugars, or sodium.   Maintain a healthy weight Body mass index (BMI) is used to identify weight problems. It estimates body fat based on height and weight. Your health care provider can help determine your BMI and help you achieve or maintain a healthy weight. Get regular exercise Get regular exercise. This is one of the most important things you can do for your health. Most adults should:  Exercise for at least 150 minutes each week. The exercise should increase your heart rate and make you sweat (moderate-intensity exercise).  Do strengthening exercises at least twice a week. This is in addition to the moderate-intensity exercise.  Spend less time sitting. Even light physical activity can be beneficial. Watch cholesterol and blood lipids Have your blood tested for lipids and cholesterol at 64 years of age, then have this test every 5 years. Have your cholesterol levels checked more often if:  Your lipid or cholesterol levels are high.  You are older than 64 years of age.  You are at high risk for heart disease. What should I know about cancer screening? Depending on your health history and family history, you may need to have cancer screening at various ages. This may include screening for:  Breast cancer.  Cervical cancer.  Colorectal cancer.  Skin cancer.  Lung cancer. What should I know  about heart disease, diabetes, and high blood pressure? Blood pressure and heart disease  High blood pressure causes heart disease and increases the risk of stroke. This is more likely to develop in people who have high blood pressure readings, are of African descent, or are overweight.  Have your blood pressure checked: ? Every 3-5 years if you are 21-17 years of age. ? Every year if you are 10 years old or older. Diabetes Have regular diabetes screenings. This checks your fasting blood sugar level. Have the screening done:  Once every three years after age 25 if you are at a normal weight and have a low risk for diabetes.  More often and at a younger age if you are overweight or have a high risk for diabetes. What should I know about preventing infection? Hepatitis B If you have a higher risk for hepatitis B, you should be screened for this virus. Talk with your health care provider to find out if you are at risk for hepatitis B infection. Hepatitis C Testing is recommended for:  Everyone born from [birth year] through 1964.  Anyone with known risk factors for hepatitis C. Sexually transmitted infections (STIs)  Get screened for STIs, including gonorrhea and chlamydia, if: ? You are sexually active and are younger than 64 years of age. ? You are older than 64 years of age and your health care provider tells you that you are at risk for this type of infection. ? Your sexual activity has changed since you were last screened, and you are  at increased risk for chlamydia or gonorrhea. Ask your health care provider if you are at risk.  Ask your health care provider about whether you are at high risk for HIV. Your health care provider may recommend a prescription medicine to help prevent HIV infection. If you choose to take medicine to prevent HIV, you should first get tested for HIV. You should then be tested every 3 months for as long as you are taking the medicine. Pregnancy  If you are about  to stop having your period (premenopausal) and you may become pregnant, seek counseling before you get pregnant.  Take 400 to 800 micrograms (mcg) of folic acid every day if you become pregnant.  Ask for birth control (contraception) if you want to prevent pregnancy. Osteoporosis and menopause Osteoporosis is a disease in which the bones lose minerals and strength with aging. This can result in bone fractures. If you are 32 years old or older, or if you are at risk for osteoporosis and fractures, ask your health care provider if you should:  Be screened for bone loss.  Take a calcium or vitamin D supplement to lower your risk of fractures.  Be given hormone replacement therapy (HRT) to treat symptoms of menopause. Follow these instructions at home: Lifestyle  Do not use any products that contain nicotine or tobacco, such as cigarettes, e-cigarettes, and chewing tobacco. If you need help quitting, ask your health care provider.  Do not use street drugs.  Do not share needles.  Ask your health care provider for help if you need support or information about quitting drugs. Alcohol use  Do not drink alcohol if: ? Your health care provider tells you not to drink. ? You are pregnant, may be pregnant, or are planning to become pregnant.  If you drink alcohol: ? Limit how much you use to 0-1 drink a day. ? Limit intake if you are breastfeeding.  Be aware of how much alcohol is in your drink. In the [place] one drink equals one 12 oz bottle of beer (355 mL), one 5 oz glass of wine (148 mL), or one 1 oz glass of hard liquor (44 mL). General instructions  Schedule regular health, dental, and eye exams.  Stay current with your vaccines.  Tell your health care provider if: ? You often feel depressed. ? You have ever been abused or do not feel safe at home. Summary  Adopting a healthy lifestyle and getting preventive care are important in promoting health and wellness.  Follow your  health care provider's instructions about healthy diet, exercising, and getting tested or screened for diseases.  Follow your health care provider's instructions on monitoring your cholesterol and blood pressure. This information is not intended to replace advice given to you by your health care provider. Make sure you discuss any questions you have with your health care provider. Document Revised: 08/15/2018 Document Reviewed: 08/15/2018 Elsevier Patient Education  2021 [employer].

## 2020-09-14 NOTE — Assessment & Plan Note (Signed)
Flu shot up to date. Covid-19 up to date including booster. Shingrix complete. Tetanus up to date. Colonoscopy up to date until 2025. Mammogram up to date, pap smear up to date with gyn. Counseled about sun safety and mole surveillance. Counseled about the dangers of distracted driving. Given 10 year screening recommendations.

## 2021-08-05 ENCOUNTER — Telehealth: Payer: Self-pay | Admitting: Internal Medicine

## 2021-08-05 DIAGNOSIS — Z0000 Encounter for general adult medical examination without abnormal findings: Secondary | ICD-10-CM

## 2021-08-05 NOTE — Telephone Encounter (Signed)
Patient called in to schedule CPE 09/15/21  Patient would like lab orders placed for blood work before appt so she can have them back to discuss at visit  Please let patient know when orders have been placed [phone]

## 2021-08-05 NOTE — Telephone Encounter (Signed)
See below

## 2021-08-06 NOTE — Telephone Encounter (Signed)
Called the patient. She has been scheduled for September 13, 2021 at 8:15 am to have her bloodwork done prior to her physical. Patient is aware of appt date and time.

## 2021-08-06 NOTE — Telephone Encounter (Signed)
Labs placed.

## 2021-08-25 IMAGING — MG DIGITAL SCREENING BILAT W/ TOMO W/ CAD
8 series · 8 of 24 positions shown · non-contrast
Comparison: Previous exam(s).

CLINICAL DATA: Screening.

EXAM:
DIGITAL SCREENING BILATERAL MAMMOGRAM WITH TOMO AND CAD

[L CC synth-2D]
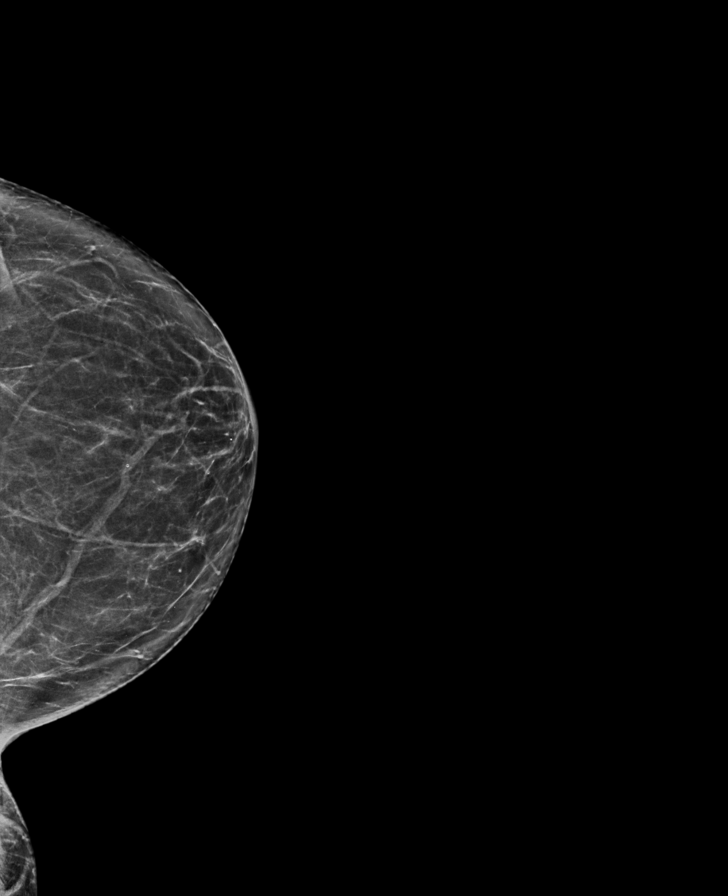

[R CC synth-2D]
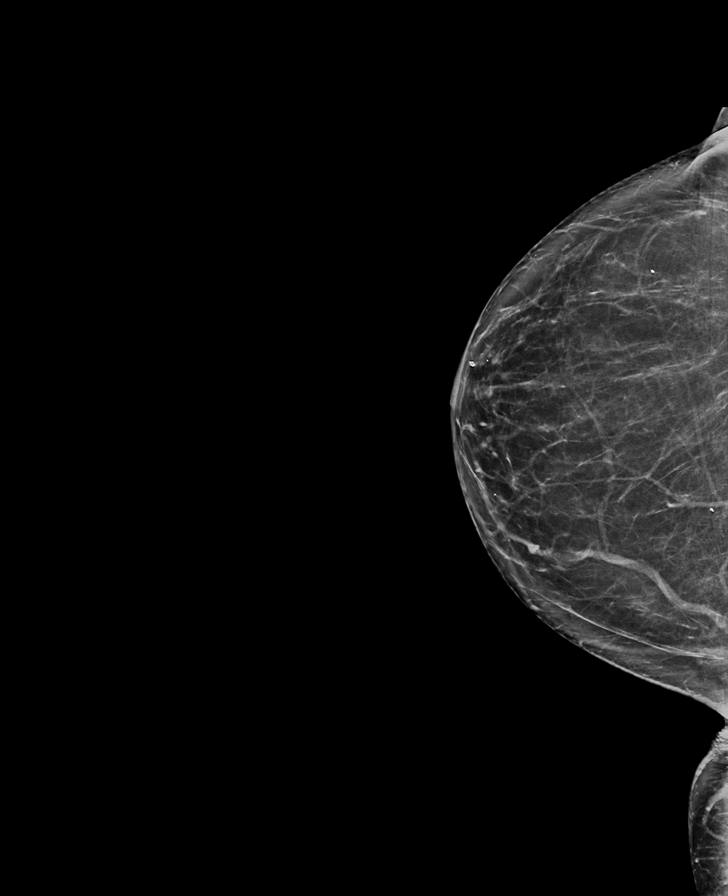

[L MLO synth-2D]
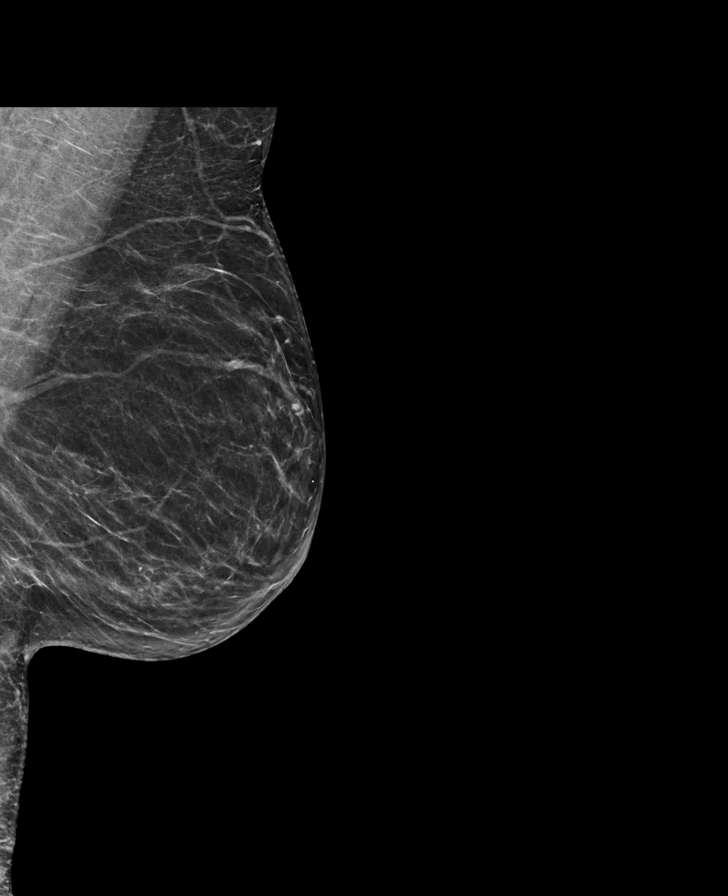

[R MLO synth-2D]
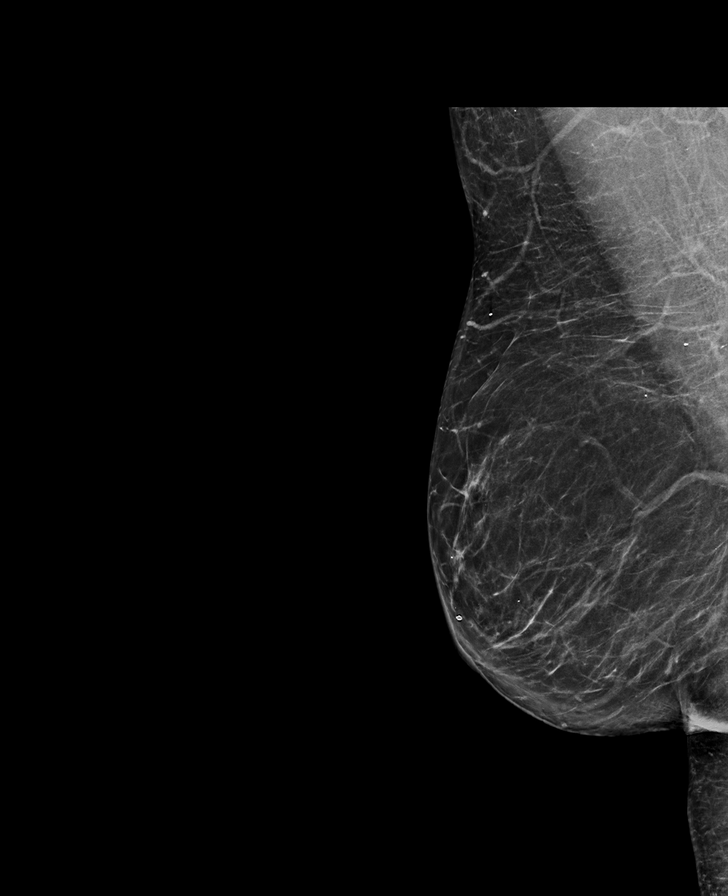

[R CC tomo · tomo slice 35/70.0]
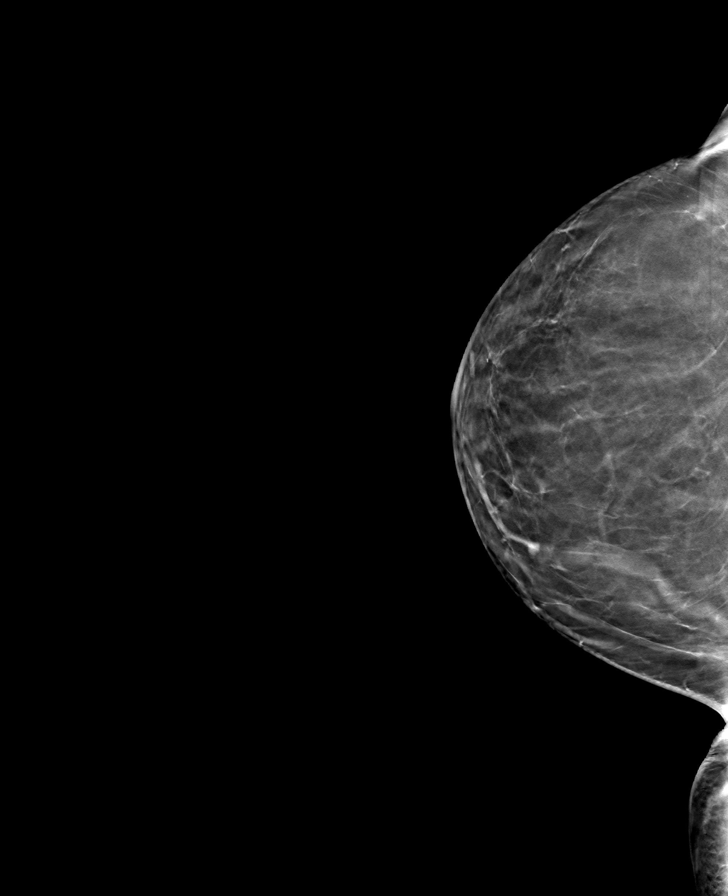

[L MLO tomo · tomo slice 35/68.0]
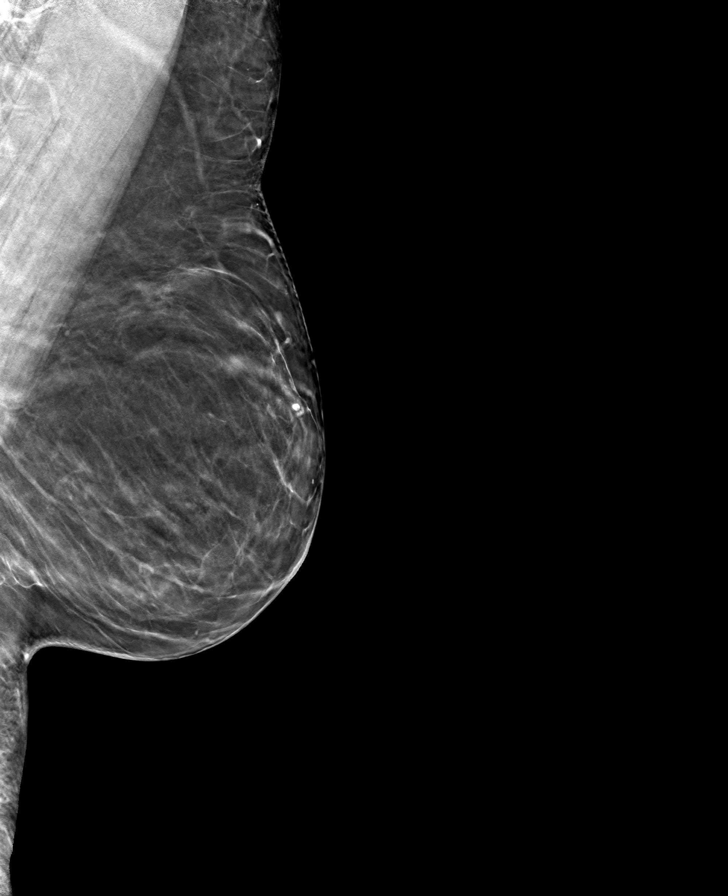

[L CC tomo · tomo slice 33/65.0]
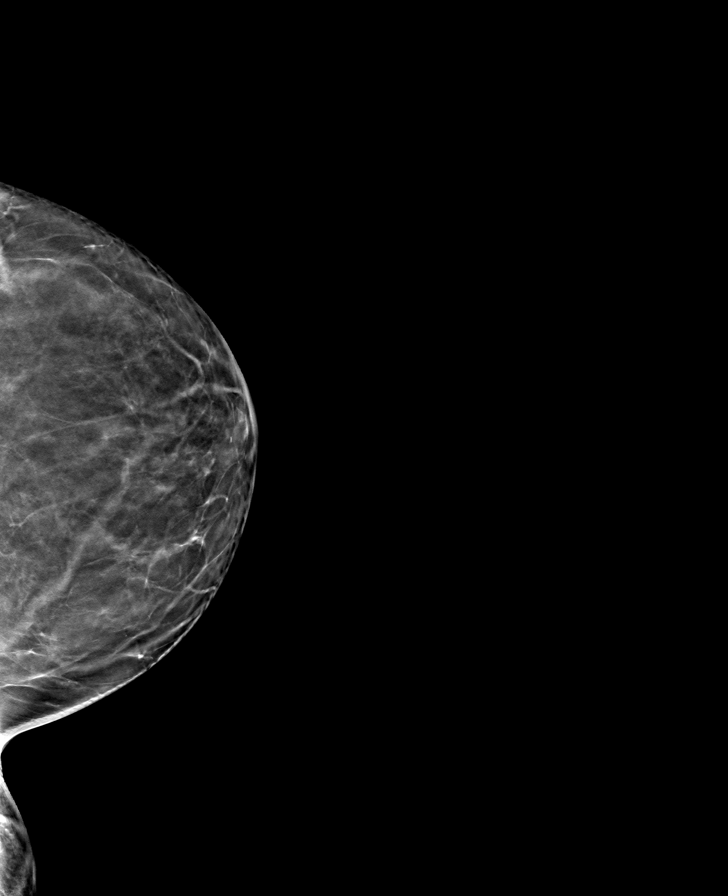

[R MLO tomo · tomo slice 37/74.0]
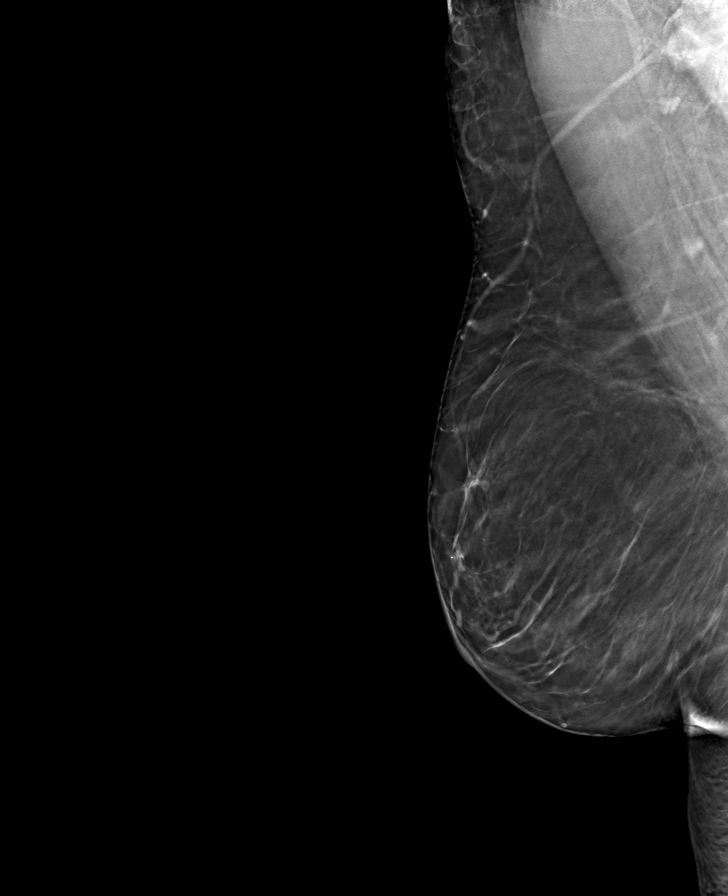

[8 of 24 positions shown; findings below may reference images not displayed]

ACR Breast Density Category b: There are scattered areas of
fibroglandular density.
FINDINGS: There are no findings suspicious for malignancy. Images were
processed with CAD.
IMPRESSION: No mammographic evidence of malignancy. A result letter of this
screening mammogram will be mailed directly to the patient.

RECOMMENDATION:
Screening mammogram in one year. (Code:[id number])

BI-RADS CATEGORY  1: Negative.

## 2021-09-13 ENCOUNTER — Other Ambulatory Visit: Payer: Self-pay

## 2021-09-13 ENCOUNTER — Other Ambulatory Visit (INDEPENDENT_AMBULATORY_CARE_PROVIDER_SITE_OTHER): Payer: BC Managed Care – PPO

## 2021-09-13 DIAGNOSIS — Z0000 Encounter for general adult medical examination without abnormal findings: Secondary | ICD-10-CM

## 2021-09-13 LAB — LIPID PANEL
Cholesterol: 185 mg/dL (ref 0–200)
HDL: 48.8 mg/dL (ref >39.00)
LDL Cholesterol: 105 mg/dL — ABNORMAL HIGH (ref 0–99)
NonHDL: 136.55
Total CHOL/HDL Ratio: 4
Triglycerides: 158 mg/dL — ABNORMAL HIGH (ref 0.0–149.0)
VLDL: 31.6 mg/dL (ref 0.0–40.0)

## 2021-09-13 LAB — COMPREHENSIVE METABOLIC PANEL
ALT: 21 U/L (ref 0–35)
AST: 19 U/L (ref 0–37)
Albumin: 4 g/dL (ref 3.5–5.2)
Alkaline Phosphatase: 59 U/L (ref 39–117)
BUN: 13 mg/dL (ref 6–23)
CO2: 28 mEq/L (ref 19–32)
Calcium: 9.4 mg/dL (ref 8.4–10.5)
Chloride: 103 mEq/L (ref 96–112)
Creatinine, Ser: 0.89 mg/dL (ref 0.40–1.20)
GFR: 68.67 mL/min (ref >60.00)
Glucose, Bld: 93 mg/dL (ref 70–99)
Potassium: 4 mEq/L (ref 3.5–5.1)
Sodium: 138 mEq/L (ref 135–145)
Total Bilirubin: 0.6 mg/dL (ref 0.2–1.2)
Total Protein: 7.4 g/dL (ref 6.0–8.3)

## 2021-09-13 LAB — CBC
HCT: 41 % (ref 36.0–46.0)
Hemoglobin: 13.7 g/dL (ref 12.0–15.0)
MCHC: 33.4 g/dL (ref 30.0–36.0)
MCV: 92.1 fl (ref 78.0–100.0)
Platelets: 263 10*3/uL (ref 150.0–400.0)
RBC: 4.45 Mil/uL (ref 3.87–5.11)
RDW: 12.5 % (ref 11.5–15.5)
WBC: 11.1 10*3/uL — ABNORMAL HIGH (ref 4.0–10.5)

## 2021-09-15 ENCOUNTER — Encounter: Payer: BC Managed Care – PPO | Admitting: Internal Medicine

## 2021-09-24 ENCOUNTER — Other Ambulatory Visit: Payer: Self-pay

## 2021-09-24 ENCOUNTER — Encounter: Payer: Self-pay | Admitting: Internal Medicine

## 2021-09-24 ENCOUNTER — Ambulatory Visit (INDEPENDENT_AMBULATORY_CARE_PROVIDER_SITE_OTHER): Payer: BC Managed Care – PPO | Admitting: Internal Medicine

## 2021-09-24 VITALS — BP 120/82 | HR 83 | Resp 18 | Ht 66.0 in | Wt 167.2 lb

## 2021-09-24 DIAGNOSIS — Z0000 Encounter for general adult medical examination without abnormal findings: Secondary | ICD-10-CM

## 2021-09-24 DIAGNOSIS — N390 Urinary tract infection, site not specified: Secondary | ICD-10-CM

## 2021-09-24 DIAGNOSIS — R21 Rash and other nonspecific skin eruption: Secondary | ICD-10-CM | POA: Insufficient documentation

## 2021-09-24 DIAGNOSIS — K219 Gastro-esophageal reflux disease without esophagitis: Secondary | ICD-10-CM

## 2021-09-24 DIAGNOSIS — Z86010 Personal history of colonic polyps: Secondary | ICD-10-CM

## 2021-09-24 MED ORDER — TRIAMCINOLONE ACETONIDE 0.1 % EX CREA: 1.0000 "application " | TOPICAL_CREAM | Freq: Two times a day (BID) | CUTANEOUS | 3 refills | Status: DC

## 2021-09-24 MED ORDER — AMOXICILLIN 500 MG PO CAPS: 500.0000 mg | ORAL_CAPSULE | Freq: Every day | ORAL | 1 refills | Status: DC

## 2021-09-24 MED ORDER — CLINDAMYCIN PHOSPHATE 1 % EX GEL: Freq: Two times a day (BID) | CUTANEOUS | 3 refills | Status: DC

## 2021-09-24 MED ORDER — PANTOPRAZOLE SODIUM 40 MG PO TBEC: 40.0000 mg | DELAYED_RELEASE_TABLET | Freq: Every day | ORAL | 3 refills | Status: DC

## 2021-09-24 NOTE — Patient Instructions (Signed)
We have sent in the refills.  

## 2021-09-24 NOTE — Assessment & Plan Note (Signed)
Refill amoxicillin to use prn for UTI infection and doing well.

## 2021-09-24 NOTE — Assessment & Plan Note (Signed)
Rx protonix 40 mg daily which will replace omeprazole 20 mg daily and pepcid 20 mg daily otc.

## 2021-09-24 NOTE — Assessment & Plan Note (Signed)
Flu shot up to date. Covid-19 booster recommended. Shingrix complete. Tetanus up to date. Colonoscopy referral to GI as due. Mammogram with gyn, pap smear with gyn. Counseled about sun safety and mole surveillance. Counseled about the dangers of distracted driving. Given 10 year screening recommendations.

## 2021-09-24 NOTE — Assessment & Plan Note (Signed)
Refill triamcinolone ointment and clindamycin gel for rosacea and eczema.

## 2021-09-24 NOTE — Progress Notes (Signed)
° °  Subjective:   Patient ID: Kerri Allen, female    DOB: 1956-07-23, 66 y.o.   MRN: [id number]  HPI The patient is a 66 YO female coming in for physical.   PMH, FMH, social history reviewed and updated  Review of Systems  Constitutional: Negative.   HENT: Negative.    Eyes: Negative.   Respiratory:  Negative for cough, chest tightness and shortness of breath.   Cardiovascular:  Negative for chest pain, palpitations and leg swelling.  Gastrointestinal:  Negative for abdominal distention, abdominal pain, constipation, diarrhea, nausea and vomiting.  Musculoskeletal: Negative.   Skin: Negative.   Neurological: Negative.   Psychiatric/Behavioral: Negative.     Objective:  Physical Exam Constitutional:      Appearance: She is well-developed.  HENT:     Head: Normocephalic and atraumatic.  Cardiovascular:     Rate and Rhythm: Normal rate and regular rhythm.  Pulmonary:     Effort: Pulmonary effort is normal. No respiratory distress.     Breath sounds: Normal breath sounds. No wheezing or rales.  Abdominal:     General: Bowel sounds are normal. There is no distension.     Palpations: Abdomen is soft.     Tenderness: There is no abdominal tenderness. There is no rebound.  Musculoskeletal:     Cervical back: Normal range of motion.  Skin:    General: Skin is warm and dry.  Neurological:     Mental Status: She is alert and oriented to person, place, and time.     Coordination: Coordination normal.    Vitals:   09/24/21 0834  BP: 120/82  Pulse: 83  Resp: 18  SpO2: 99%  Weight: 167 lb 3.2 oz (75.8 kg)  Height: 5\' 6"  (1.676 m)    This visit occurred during the SARS-CoV-2 public health emergency.  Safety protocols were in place, including screening questions prior to the visit, additional usage of staff PPE, and extensive cleaning of exam room while observing appropriate contact time as indicated for disinfecting solutions.   Assessment & Plan:

## 2021-10-06 ENCOUNTER — Encounter: Payer: Self-pay | Admitting: Gastroenterology

## 2021-10-19 ENCOUNTER — Telehealth: Payer: Self-pay | Admitting: *Deleted

## 2021-10-19 NOTE — Telephone Encounter (Signed)
This patient is scheduled for a direct colonoscopy. Per referral hx colon polyps. I do not see any past colon reports in epic. Then per PCP ov notes states colonoscopy up to date until 2025. Please call this patient and get the last colonoscopy report and path report. Thanks, Desteni Piscopo pv

## 2021-10-20 NOTE — Telephone Encounter (Signed)
Noted thanks °

## 2021-10-20 NOTE — Telephone Encounter (Signed)
Inbound call from patient states her last colonoscopy was 10 years ago in [place] and she does not remember the facility.

## 2021-11-04 ENCOUNTER — Ambulatory Visit (AMBULATORY_SURGERY_CENTER): Payer: BC Managed Care – PPO | Admitting: *Deleted

## 2021-11-04 ENCOUNTER — Other Ambulatory Visit: Payer: Self-pay

## 2021-11-04 VITALS — Ht 66.0 in | Wt 165.0 lb

## 2021-11-04 DIAGNOSIS — Z1211 Encounter for screening for malignant neoplasm of colon: Secondary | ICD-10-CM

## 2021-11-04 MED ORDER — NA SULFATE-K SULFATE-MG SULF 17.5-3.13-1.6 GM/177ML PO SOLN: 1.0000 | Freq: Once | ORAL | 0 refills | Status: AC

## 2021-11-04 NOTE — Progress Notes (Signed)
No egg or soy allergy known to patient  °No issues known to pt with past sedation with any surgeries or procedures °Patient denies ever being told they had issues or difficulty with intubation  °No FH of Malignant Hyperthermia °Pt is not on diet pills °Pt is not on  home 02  °Pt is not on blood thinners  °Pt denies issues with constipation  °No A fib or A flutter ° °Pt is  vaccinated  for Covid  ° °Due to the COVID-19 pandemic we are asking patients to follow certain guidelines in PV and the [facility]   °Pt aware of COVID protocols and [facility] guidelines  ° °PV completed over the phone. Pt verified name, DOB, address and insurance during PV today.  °Pt mailed instruction packet with copy of consent form to read and not return, and instructions.  °Pt encouraged to call with questions or issues.  °If pt has My chart, procedure instructions sent via My Chart   ° °Colonoscopy classification sheet mailed with packet. °

## 2021-11-12 ENCOUNTER — Ambulatory Visit (INDEPENDENT_AMBULATORY_CARE_PROVIDER_SITE_OTHER): Payer: BC Managed Care – PPO | Admitting: Internal Medicine

## 2021-11-12 ENCOUNTER — Other Ambulatory Visit: Payer: Self-pay

## 2021-11-12 ENCOUNTER — Encounter: Payer: Self-pay | Admitting: Internal Medicine

## 2021-11-12 VITALS — BP 136/82 | HR 94 | Temp 98.0°F | Ht 66.0 in | Wt 169.1 lb

## 2021-11-12 DIAGNOSIS — N390 Urinary tract infection, site not specified: Secondary | ICD-10-CM | POA: Diagnosis not present

## 2021-11-12 LAB — POCT URINALYSIS DIPSTICK
Bilirubin, UA: NEGATIVE
Glucose, UA: NEGATIVE
Ketones, UA: NEGATIVE
Nitrite, UA: NEGATIVE
Protein, UA: NEGATIVE
Spec Grav, UA: 1.01 (ref 1.010–1.025)
Urobilinogen, UA: NEGATIVE E.U./dL — AB
pH, UA: 6 (ref 5.0–8.0)

## 2021-11-12 LAB — CBC
HCT: 41.2 % (ref 36.0–46.0)
Hemoglobin: 13.8 g/dL (ref 12.0–15.0)
MCHC: 33.6 g/dL (ref 30.0–36.0)
MCV: 91.5 fl (ref 78.0–100.0)
Platelets: 286 10*3/uL (ref 150.0–400.0)
RBC: 4.5 Mil/uL (ref 3.87–5.11)
RDW: 12.8 % (ref 11.5–15.5)
WBC: 12.7 10*3/uL — ABNORMAL HIGH (ref 4.0–10.5)

## 2021-11-12 MED ORDER — CIPROFLOXACIN HCL 500 MG PO TABS: 500.0000 mg | ORAL_TABLET | Freq: Two times a day (BID) | ORAL | 0 refills | Status: AC

## 2021-11-12 NOTE — Progress Notes (Signed)
? ?  Subjective:  ? ?Patient ID: Kerri Allen, female    DOB: 10/30/1955, 66 y.o.   MRN: [id number] ? ?HPI ?The patient is a 66 YO female coming in for possible UTI. Taking antibiotics.  ? ?Review of Systems  ?Constitutional: Negative.   ?Respiratory: Negative.    ?Cardiovascular: Negative.   ?Gastrointestinal:  Positive for abdominal pain. Negative for abdominal distention, constipation, diarrhea, nausea and vomiting.  ?Genitourinary:  Positive for dysuria, frequency and urgency.  ?Musculoskeletal: Negative.   ?Skin: Negative.   ? ?Objective:  ?Physical Exam ?Constitutional:   ?   Appearance: She is well-developed.  ?HENT:  ?   Head: Normocephalic and atraumatic.  ?Cardiovascular:  ?   Rate and Rhythm: Normal rate and regular rhythm.  ?Pulmonary:  ?   Effort: Pulmonary effort is normal. No respiratory distress.  ?   Breath sounds: Normal breath sounds. No wheezing or rales.  ?Abdominal:  ?   General: Bowel sounds are normal. There is no distension.  ?   Palpations: Abdomen is soft.  ?   Tenderness: There is no abdominal tenderness. There is no rebound.  ?Musculoskeletal:  ?   Cervical back: Normal range of motion.  ?Skin: ?   General: Skin is warm and dry.  ?Neurological:  ?   Mental Status: She is alert and oriented to person, place, and time.  ?   Coordination: Coordination normal.  ? ? ?Vitals:  ? 11/12/21 1335  ?BP: 136/82  ?Pulse: 94  ?Temp: 98 ?F (36.7 ?C)  ?TempSrc: Oral  ?SpO2: 97%  ?Weight: 169 lb 2 oz (76.7 kg)  ?Height: 5\' 6"  (1.676 m)  ? ? ?This visit occurred during the SARS-CoV-2 public health emergency.  Safety protocols were in place, including screening questions prior to the visit, additional usage of staff PPE, and extensive cleaning of exam room while observing appropriate contact time as indicated for disinfecting solutions.  ? ?Assessment & Plan:  ? ?

## 2021-11-12 NOTE — Assessment & Plan Note (Addendum)
Checking U/A in office consistent with UTI. Checking urine culture as she has been on daily amoxicillin 500 mg daily for prevention. Rx cipro 1 week 500 mg BID as likely pcn resistant. Adjust with culture results. Checking CBC for leukocytosis.  ?

## 2021-11-12 NOTE — Patient Instructions (Addendum)
We will send in cipro to take 1 pill twice a day for 1 week. ? ?We will check the labs and the culture. ? ? ?

## 2021-11-15 LAB — URINE CULTURE

## 2021-11-18 ENCOUNTER — Encounter: Payer: BC Managed Care – PPO | Admitting: Gastroenterology

## 2021-11-29 ENCOUNTER — Other Ambulatory Visit: Payer: Self-pay | Admitting: Internal Medicine

## 2022-06-27 ENCOUNTER — Other Ambulatory Visit: Payer: Self-pay | Admitting: Internal Medicine

## 2022-07-20 ENCOUNTER — Telehealth: Payer: Self-pay | Admitting: Internal Medicine

## 2022-07-20 ENCOUNTER — Encounter: Payer: Self-pay | Admitting: Family Medicine

## 2022-07-20 ENCOUNTER — Ambulatory Visit (INDEPENDENT_AMBULATORY_CARE_PROVIDER_SITE_OTHER): Payer: BC Managed Care – PPO | Admitting: Family Medicine

## 2022-07-20 VITALS — BP 136/90 | HR 108 | Temp 97.6°F | Ht 66.0 in

## 2022-07-20 DIAGNOSIS — N390 Urinary tract infection, site not specified: Secondary | ICD-10-CM

## 2022-07-20 DIAGNOSIS — R319 Hematuria, unspecified: Secondary | ICD-10-CM | POA: Diagnosis not present

## 2022-07-20 DIAGNOSIS — R350 Frequency of micturition: Secondary | ICD-10-CM | POA: Diagnosis not present

## 2022-07-20 LAB — POCT URINALYSIS DIPSTICK
Bilirubin, UA: NEGATIVE
Blood, UA: POSITIVE
Glucose, UA: NEGATIVE
Ketones, UA: NEGATIVE
Leukocytes, UA: NEGATIVE
Nitrite, UA: NEGATIVE
Protein, UA: POSITIVE — AB
Spec Grav, UA: 1.025 (ref 1.010–1.025)
Urobilinogen, UA: 0.2 E.U./dL
pH, UA: 6 (ref 5.0–8.0)

## 2022-07-20 NOTE — Patient Instructions (Signed)
As discussed, we will be in touch with your urine culture results.  If you have a UTI then we will treated accordingly. Drink plenty of water.  Continue your current maintenance antibiotic.  The blood in your urine could be a sign of a kidney stone.  If you have any worsening pain, fever, chills, vomiting or feel like you are sick, you will need to be seen again.

## 2022-07-20 NOTE — Telephone Encounter (Signed)
Patient came in and  scheduled her CPE for January. Patient is requesting orders to be put in for blood work to be done a couple days before her scheduled CPE appt.

## 2022-07-20 NOTE — Progress Notes (Signed)
Subjective:  Kerri Allen is a 66 y.o. female who complains of possible urinary tract infection.  She has had symptoms for 2 day.  Symptoms include  urinary frequency, urgency, and pelvic pain . States pain is different from in the past with usual UTIs Patient denies fever, chills, back pain, N/V/D.  She is taking maintenance  Amoxicillin 500 mg  She does not want to see urology until she is on Medicare which will be in the next few weeks.   Patient does have a history of recurrent UTI. No other aggravating or relieving factors.  No other c/o.  Past Medical History:  Diagnosis Date   Anemia    as infant-63 months of age   Arthritis    Blood transfusion without reported diagnosis    at 18 months   GERD (gastroesophageal reflux disease)    Hypertension    Raynaud's syndrome    2005 or 2006   Thyroid disease     ROS as in subjective  Reviewed allergies, medications, past medical, surgical, and social history.    Objective: Vitals:   07/20/22 1619  BP: (!) 136/90  Pulse: (!) 108  Temp: 97.6 F (36.4 C)  SpO2: 98%    General appearance: alert, no distress, WD/WN, female Abdomen: soft, non tender, non distended Back: no CVA tenderness      Laboratory:  Urine dipstick:  +blood and protein, - leuks, nit .       Assessment: Hematuria, unspecified type - Plan: Urine Culture, Urine Culture  Frequent urination - Plan: POCT urinalysis dipstick, Urine Culture, Urine Culture  Frequent UTI - Plan: POCT urinalysis dipstick, Urine Culture, Urine Culture   Plan: Discussed that she may have a renal stone. Declines CT at this time due to financial concerns and insurance. Declines referral to urology.  Urine culture ordered.  Continue maintenance Amoxicillin.  Advised increased water intake, can use OTC Tylenol for pain.    Advised that she would need to be seen here or at UC/ED if she is getting worse with fever, chills, pain, vomiting, etc.   She will follow up with  Dr. Okey Dupre after August 05, 2022 when her Medicare kicks in.

## 2022-07-21 NOTE — Telephone Encounter (Signed)
Is this ok to enter. Pt has [employer]Raechel Chute

## 2022-07-22 LAB — URINE CULTURE: Result:: NO GROWTH

## 2022-08-08 ENCOUNTER — Encounter: Payer: Self-pay | Admitting: Internal Medicine

## 2022-08-08 ENCOUNTER — Ambulatory Visit (INDEPENDENT_AMBULATORY_CARE_PROVIDER_SITE_OTHER): Payer: Medicare Other | Admitting: Internal Medicine

## 2022-08-08 VITALS — BP 146/96 | HR 91 | Temp 97.7°F | Ht 66.0 in | Wt 167.0 lb

## 2022-08-08 DIAGNOSIS — N390 Urinary tract infection, site not specified: Secondary | ICD-10-CM | POA: Diagnosis not present

## 2022-08-08 DIAGNOSIS — Z0000 Encounter for general adult medical examination without abnormal findings: Secondary | ICD-10-CM

## 2022-08-08 DIAGNOSIS — R030 Elevated blood-pressure reading, without diagnosis of hypertension: Secondary | ICD-10-CM | POA: Insufficient documentation

## 2022-08-08 DIAGNOSIS — R3129 Other microscopic hematuria: Secondary | ICD-10-CM | POA: Insufficient documentation

## 2022-08-08 DIAGNOSIS — I10 Essential (primary) hypertension: Secondary | ICD-10-CM | POA: Insufficient documentation

## 2022-08-08 NOTE — Assessment & Plan Note (Signed)
Using amoxicillin 500 mg daily for prevention and doing well overall only 1 UTI this year. Will continue at same dosing.

## 2022-08-08 NOTE — Assessment & Plan Note (Signed)
BP high today and going through a lot of stress selling house and moving to condo in near future. She is downsizing. Prior BP not elevated and will exclude today as likely outlier. Will continue to monitor at every visit and she is returning in January.

## 2022-08-08 NOTE — Assessment & Plan Note (Signed)
Checking U/A and culture with future labs in January. She has not had hematuria outside of UTI in the past. If persistent we can refer to urology and counseled about potential workup.

## 2022-08-08 NOTE — Telephone Encounter (Signed)
Handled.   

## 2022-08-08 NOTE — Progress Notes (Signed)
   Subjective:   Patient ID: Kerri Allen, female    DOB: August 06, 1956, 66 y.o.   MRN: [id number]  HPI The patient is a 66 YO female coming in for hematuria (found in microscopic, not gross) and frequent UTI. Taking amoxicillin for prevention no recent symptoms and only 1 UTI this year.  Review of Systems  Constitutional: Negative.   HENT: Negative.    Eyes: Negative.   Respiratory:  Negative for cough, chest tightness and shortness of breath.   Cardiovascular:  Negative for chest pain, palpitations and leg swelling.  Gastrointestinal:  Negative for abdominal distention, abdominal pain, constipation, diarrhea, nausea and vomiting.  Musculoskeletal: Negative.   Skin: Negative.   Neurological: Negative.   Psychiatric/Behavioral: Negative.      Objective:  Physical Exam Constitutional:      Appearance: She is well-developed.  HENT:     Head: Normocephalic and atraumatic.  Cardiovascular:     Rate and Rhythm: Normal rate and regular rhythm.  Pulmonary:     Effort: Pulmonary effort is normal. No respiratory distress.     Breath sounds: Normal breath sounds. No wheezing or rales.  Abdominal:     General: Bowel sounds are normal. There is no distension.     Palpations: Abdomen is soft.     Tenderness: There is no abdominal tenderness. There is no rebound.  Musculoskeletal:     Cervical back: Normal range of motion.  Skin:    General: Skin is warm and dry.  Neurological:     Mental Status: She is alert and oriented to person, place, and time.     Coordination: Coordination normal.     Vitals:   08/08/22 1040 08/08/22 1048  BP: (!) 160/100 (!) 146/96  Pulse: 91   Temp: 97.7 F (36.5 C)   TempSrc: Oral   SpO2: 98%   Weight: 167 lb (75.8 kg)   Height: 5\' 6"  (1.676 m)     Assessment & Plan:

## 2022-09-16 ENCOUNTER — Encounter: Payer: BC Managed Care – PPO | Admitting: Internal Medicine

## 2022-10-06 ENCOUNTER — Other Ambulatory Visit: Payer: Self-pay | Admitting: Internal Medicine

## 2022-10-12 DIAGNOSIS — K080 Exfoliation of teeth due to systemic causes: Secondary | ICD-10-CM | POA: Diagnosis not present

## 2022-10-13 ENCOUNTER — Other Ambulatory Visit (INDEPENDENT_AMBULATORY_CARE_PROVIDER_SITE_OTHER): Payer: Medicare Other

## 2022-10-13 DIAGNOSIS — Z0000 Encounter for general adult medical examination without abnormal findings: Secondary | ICD-10-CM

## 2022-10-13 DIAGNOSIS — R3129 Other microscopic hematuria: Secondary | ICD-10-CM

## 2022-10-13 LAB — COMPREHENSIVE METABOLIC PANEL
ALT: 19 U/L (ref 0–35)
AST: 20 U/L (ref 0–37)
Albumin: 4 g/dL (ref 3.5–5.2)
Alkaline Phosphatase: 79 U/L (ref 39–117)
BUN: 14 mg/dL (ref 6–23)
CO2: 26 mEq/L (ref 19–32)
Calcium: 9.6 mg/dL (ref 8.4–10.5)
Chloride: 101 mEq/L (ref 96–112)
Creatinine, Ser: 1.01 mg/dL (ref 0.40–1.20)
GFR: 58.56 mL/min — ABNORMAL LOW (ref >60.00)
Glucose, Bld: 94 mg/dL (ref 70–99)
Potassium: 5.1 mEq/L (ref 3.5–5.1)
Sodium: 137 mEq/L (ref 135–145)
Total Bilirubin: 0.5 mg/dL (ref 0.2–1.2)
Total Protein: 7 g/dL (ref 6.0–8.3)

## 2022-10-13 LAB — LIPID PANEL
Cholesterol: 220 mg/dL — ABNORMAL HIGH (ref 0–200)
HDL: 45.4 mg/dL (ref >39.00)
LDL Cholesterol: 136 mg/dL — ABNORMAL HIGH (ref 0–99)
NonHDL: 174.99
Total CHOL/HDL Ratio: 5
Triglycerides: 197 mg/dL — ABNORMAL HIGH (ref 0.0–149.0)
VLDL: 39.4 mg/dL (ref 0.0–40.0)

## 2022-10-13 LAB — URINALYSIS, ROUTINE W REFLEX MICROSCOPIC
Bilirubin Urine: NEGATIVE
Hgb urine dipstick: NEGATIVE
Ketones, ur: NEGATIVE
Leukocytes,Ua: NEGATIVE
Nitrite: NEGATIVE
Specific Gravity, Urine: <=1.005 — AB (ref 1.000–1.030)
Total Protein, Urine: NEGATIVE
Urine Glucose: NEGATIVE
Urobilinogen, UA: 0.2 (ref 0.0–1.0)
WBC, UA: NONE SEEN (ref 0–?)
pH: 6.5 (ref 5.0–8.0)

## 2022-10-13 LAB — CBC
HCT: 42.3 % (ref 36.0–46.0)
Hemoglobin: 14.6 g/dL (ref 12.0–15.0)
MCHC: 34.6 g/dL (ref 30.0–36.0)
MCV: 91.1 fl (ref 78.0–100.0)
Platelets: 283 10*3/uL (ref 150.0–400.0)
RBC: 4.64 Mil/uL (ref 3.87–5.11)
RDW: 13.2 % (ref 11.5–15.5)
WBC: 9 10*3/uL (ref 4.0–10.5)

## 2022-10-14 LAB — URINE CULTURE: Result:: NO GROWTH

## 2022-10-17 ENCOUNTER — Encounter: Payer: BC Managed Care – PPO | Admitting: Internal Medicine

## 2022-10-18 ENCOUNTER — Ambulatory Visit (INDEPENDENT_AMBULATORY_CARE_PROVIDER_SITE_OTHER): Payer: Medicare Other | Admitting: Internal Medicine

## 2022-10-18 ENCOUNTER — Encounter: Payer: Self-pay | Admitting: Internal Medicine

## 2022-10-18 VITALS — BP 160/100 | HR 76 | Temp 97.6°F | Ht 66.0 in | Wt 166.5 lb

## 2022-10-18 DIAGNOSIS — R5383 Other fatigue: Secondary | ICD-10-CM | POA: Diagnosis not present

## 2022-10-18 DIAGNOSIS — Z0000 Encounter for general adult medical examination without abnormal findings: Secondary | ICD-10-CM | POA: Diagnosis not present

## 2022-10-18 DIAGNOSIS — Z23 Encounter for immunization: Secondary | ICD-10-CM | POA: Diagnosis not present

## 2022-10-18 DIAGNOSIS — K219 Gastro-esophageal reflux disease without esophagitis: Secondary | ICD-10-CM

## 2022-10-18 DIAGNOSIS — R030 Elevated blood-pressure reading, without diagnosis of hypertension: Secondary | ICD-10-CM

## 2022-10-18 LAB — TSH: TSH: 3.43 u[IU]/mL (ref 0.35–5.50)

## 2022-10-18 LAB — HEMOGLOBIN A1C: Hgb A1c MFr Bld: 5.8 % (ref 4.6–6.5)

## 2022-10-18 LAB — T4, FREE: Free T4: 0.72 ng/dL (ref 0.60–1.60)

## 2022-10-18 LAB — VITAMIN D 25 HYDROXY (VIT D DEFICIENCY, FRACTURES): VITD: 41.66 ng/mL (ref 30.00–100.00)

## 2022-10-18 LAB — VITAMIN B12: Vitamin B-12: 298 pg/mL (ref 211–911)

## 2022-10-18 MED ORDER — LISINOPRIL 5 MG PO TABS: 5.0000 mg | ORAL_TABLET | Freq: Every day | ORAL | 3 refills | Status: DC

## 2022-10-18 NOTE — Progress Notes (Signed)
Subjective:   Patient ID: Kerri Allen, female    DOB: 11-10-1955, 67 y.o.   MRN: [id number]  HPI Here for new to medicare wellness and physical, no new complaints. Please see A/P for status and treatment of chronic medical problems.   Diet: heart healthy Physical activity: active Depression/mood screen: negative Hearing: intact to whispered voice Visual acuity: grossly normal, wears readers, overdue for annual eye exam  ADLs: capable Fall risk: none Home safety: good Cognitive evaluation: intact to orientation, naming, recall and repetition EOL planning: adv directives discussed, in place we do not have copy  [employer] Visit from 10/18/2022 in [place] at [place] Visit from 10/18/2022 in [place] at [facility]  PHQ-9 Total Score 0         10/21/2018   12:26 PM 11/12/2021    1:41 PM 10/18/2022    9:06 AM  [place] in the past year?  1 0  Was there an injury with Fall?  0 0  Fall Risk Category Calculator  2 0  Fall Risk Category (Retired)  Moderate   (RETIRED) Patient Fall Risk Level Low fall risk    Fall risk Follow up   Falls evaluation completed   Vision Screening   Right eye Left eye Both eyes  Without correction 20/25 20/50 20/25 $  With correction 20/20 20/50 20/25 $   Functional Status Survey: Is the patient deaf or have difficulty hearing?: No Does the patient have difficulty seeing, even when wearing glasses/contacts?: No Does the patient have difficulty concentrating, remembering, or making decisions?: No Does the patient have difficulty walking or climbing stairs?: No Does the patient have difficulty dressing or bathing?: No Does the patient have difficulty doing errands alone such as visiting a doctor's office or shopping?: No  I have personally reviewed and have noted 1. The patient's medical and social history - reviewed today no  changes 2. Their use of alcohol, tobacco or illicit drugs 3. Their current medications and supplements 4. The patient's functional ability including ADL's, fall risks, home safety risks and hearing or visual impairment. 5. Diet and physical activities 6. Evidence for depression or mood disorders 7. Care team reviewed and updated 8.  The patient is not on an opioid pain medication.  Patient Care Team: Hoyt Koch, MD as PCP - General (Internal Medicine) Past Medical History:  Diagnosis Date   Anemia    as infant-43 months of age   Arthritis    Blood transfusion without reported diagnosis    at 18 months   GERD (gastroesophageal reflux disease)    Hypertension    Raynaud's syndrome    2005 or 2006   Thyroid disease    Past Surgical History:  Procedure Laterality Date   ORIF WRIST FRACTURE Right 10/21/2018   Procedure: OPEN REDUCTION INTERNAL FIXATION (ORIF) RIGHT OPEN DISTAL RADIUS/ULNA FRACTURE FRACTURE;  Surgeon: Renette Butters, MD;  Location: [facility];  Service: Orthopedics;  Laterality: Right;   TONSILLECTOMY     age 16   WISDOM TOOTH EXTRACTION     Family History  Problem Relation Age of Onset   Parkinson's disease Mother    Colon cancer Neg Hx    Colon polyps Neg Hx    Esophageal cancer Neg Hx    Rectal cancer Neg Hx    Stomach cancer Neg Hx    Review of  Systems  Constitutional:  Positive for fatigue.  HENT: Negative.    Eyes: Negative.   Respiratory:  Negative for cough, chest tightness and shortness of breath.   Cardiovascular:  Negative for chest pain, palpitations and leg swelling.  Gastrointestinal:  Negative for abdominal distention, abdominal pain, constipation, diarrhea, nausea and vomiting.  Musculoskeletal: Negative.   Skin: Negative.   Neurological: Negative.   Psychiatric/Behavioral: Negative.      Objective:  Physical Exam Constitutional:      Appearance: She is well-developed.  HENT:     Head: Normocephalic and atraumatic.   Cardiovascular:     Rate and Rhythm: Normal rate and regular rhythm.  Pulmonary:     Effort: Pulmonary effort is normal. No respiratory distress.     Breath sounds: Normal breath sounds. No wheezing or rales.  Abdominal:     General: Bowel sounds are normal. There is no distension.     Palpations: Abdomen is soft.     Tenderness: There is no abdominal tenderness. There is no rebound.  Musculoskeletal:     Cervical back: Normal range of motion.  Skin:    General: Skin is warm and dry.  Neurological:     Mental Status: She is alert and oriented to person, place, and time.     Coordination: Coordination normal.     Vitals:   10/18/22 0903  BP: (!) 160/100  Pulse: 76  Temp: 97.6 F (36.4 C)  TempSrc: Oral  SpO2: 98%  Weight: 166 lb 8 oz (75.5 kg)  Height: 5' 6"$  (1.676 m)    Assessment & Plan:  Prevnar 20 given at visit

## 2022-10-18 NOTE — Assessment & Plan Note (Signed)
Flu shot up to date. Covid-19 up to date. Pneumonia 20 given at visit. Shingrix complete. Tetanus due 2030. Colonoscopy due 2025. Mammogram due 2025, pap smear aged out and dexa up to date. Counseled about sun safety and mole surveillance. Counseled about the dangers of distracted driving. Given 10 year screening recommendations.

## 2022-10-18 NOTE — Assessment & Plan Note (Signed)
Prior BP elevated and used to take lisinopril 5 mg daily. We will resume lisinopril 5 mg daily. Recent CMP with mild reduction in GFR. She will avoid NSAIDs but no significant change in creatinine indicating likely renal function is stable.

## 2022-10-18 NOTE — Assessment & Plan Note (Signed)
Prior thyroid dysfunction. Checking HgA1c, TSH, free T4, B12 and vitamin D today and treat as appropriate.

## 2022-10-18 NOTE — Assessment & Plan Note (Signed)
Taking protonix 40 mg daily and controlling symptoms. Will plan to continue and refill as needed.

## 2023-02-16 DIAGNOSIS — K080 Exfoliation of teeth due to systemic causes: Secondary | ICD-10-CM | POA: Diagnosis not present

## 2023-07-10 DIAGNOSIS — K080 Exfoliation of teeth due to systemic causes: Secondary | ICD-10-CM | POA: Diagnosis not present

## 2023-09-26 DIAGNOSIS — K080 Exfoliation of teeth due to systemic causes: Secondary | ICD-10-CM | POA: Diagnosis not present

## 2023-10-01 ENCOUNTER — Other Ambulatory Visit: Payer: Self-pay | Admitting: Internal Medicine

## 2023-10-02 ENCOUNTER — Other Ambulatory Visit: Payer: Self-pay | Admitting: Internal Medicine

## 2023-10-02 NOTE — Telephone Encounter (Signed)
.  Last Fill: Lisnopril: 10/18/22      Pantoprazole: 02/01/824 Last OV: 10/18/22 Next OV: 10/23/23  Routing to provider for review/authorization.

## 2023-10-02 NOTE — Telephone Encounter (Signed)
Copied from CRM [id number]. Topic: Clinical - Medication Refill >> Oct 02, 2023  4:41 PM Eunice Blase wrote: Most Recent Primary Care Visit:  Provider: Hillard Danker A  Department: [facility]  Visit Type: PHYSICAL  Date: 10/18/2022  Medication: pantoprazole (PROTONIX) 40 MG tablet and lisinopril (ZESTRIL) 5 MG tablet Has the patient contacted their pharmacy? Yes (Agent: If no, request that the patient contact the pharmacy for the refill. If patient does not wish to contact the pharmacy document the reason why and proceed with request.) (Agent: If yes, when and what did the pharmacy advise?)  Is this the correct pharmacy for this prescription? Yes If no, delete pharmacy and type the correct one.  This is the patient's preferred pharmacy:  [facility] [place], [place] - [address] [place] [address] Phone: [phone] Fax: [phone]   Has the prescription been filled recently? Yes  Is the patient out of the medication? Yes  Has the patient been seen for an appointment in the last year OR does the patient have an upcoming appointment? Yes  Can we respond through MyChart? Yes  Agent: Please be advised that Rx refills may take up to 3 business days. We ask that you follow-up with your pharmacy.

## 2023-10-03 ENCOUNTER — Other Ambulatory Visit: Payer: Self-pay

## 2023-10-03 MED ORDER — LISINOPRIL 5 MG PO TABS: 5.0000 mg | ORAL_TABLET | Freq: Every day | ORAL | 0 refills | Status: DC

## 2023-10-03 MED ORDER — PANTOPRAZOLE SODIUM 40 MG PO TBEC: 40.0000 mg | DELAYED_RELEASE_TABLET | Freq: Every day | ORAL | 0 refills | Status: DC

## 2023-10-20 ENCOUNTER — Ambulatory Visit: Payer: Medicare Other

## 2023-10-20 ENCOUNTER — Other Ambulatory Visit: Payer: Self-pay | Admitting: Internal Medicine

## 2023-10-20 ENCOUNTER — Other Ambulatory Visit (INDEPENDENT_AMBULATORY_CARE_PROVIDER_SITE_OTHER): Payer: Medicare Other

## 2023-10-20 VITALS — BP 152/78 | HR 68 | Ht 66.0 in | Wt 171.2 lb

## 2023-10-20 DIAGNOSIS — E782 Mixed hyperlipidemia: Secondary | ICD-10-CM | POA: Diagnosis not present

## 2023-10-20 DIAGNOSIS — Z0000 Encounter for general adult medical examination without abnormal findings: Secondary | ICD-10-CM | POA: Diagnosis not present

## 2023-10-20 DIAGNOSIS — R7303 Prediabetes: Secondary | ICD-10-CM

## 2023-10-20 LAB — COMPREHENSIVE METABOLIC PANEL
ALT: 27 U/L (ref 0–35)
AST: 27 U/L (ref 0–37)
Albumin: 4.1 g/dL (ref 3.5–5.2)
Alkaline Phosphatase: 68 U/L (ref 39–117)
BUN: 13 mg/dL (ref 6–23)
CO2: 25 meq/L (ref 19–32)
Calcium: 9.7 mg/dL (ref 8.4–10.5)
Chloride: 105 meq/L (ref 96–112)
Creatinine, Ser: 1.09 mg/dL (ref 0.40–1.20)
GFR: 53.06 mL/min — ABNORMAL LOW (ref >60.00)
Glucose, Bld: 91 mg/dL (ref 70–99)
Potassium: 5 meq/L (ref 3.5–5.1)
Sodium: 137 meq/L (ref 135–145)
Total Bilirubin: 0.5 mg/dL (ref 0.2–1.2)
Total Protein: 7.6 g/dL (ref 6.0–8.3)

## 2023-10-20 LAB — CBC
HCT: 41.8 % (ref 36.0–46.0)
Hemoglobin: 14.2 g/dL (ref 12.0–15.0)
MCHC: 34 g/dL (ref 30.0–36.0)
MCV: 93.1 fL (ref 78.0–100.0)
Platelets: 274 10*3/uL (ref 150.0–400.0)
RBC: 4.49 Mil/uL (ref 3.87–5.11)
RDW: 12.4 % (ref 11.5–15.5)
WBC: 9.2 10*3/uL (ref 4.0–10.5)

## 2023-10-20 LAB — LIPID PANEL
Cholesterol: 210 mg/dL — ABNORMAL HIGH (ref 0–200)
HDL: 41.6 mg/dL (ref >39.00)
LDL Cholesterol: 129 mg/dL — ABNORMAL HIGH (ref 0–99)
NonHDL: 168.62
Total CHOL/HDL Ratio: 5
Triglycerides: 200 mg/dL — ABNORMAL HIGH (ref 0.0–149.0)
VLDL: 40 mg/dL (ref 0.0–40.0)

## 2023-10-20 LAB — HEMOGLOBIN A1C: Hgb A1c MFr Bld: 5.9 % (ref 4.6–6.5)

## 2023-10-20 NOTE — Progress Notes (Signed)
Subjective:   Kerri Allen is a 68 y.o. female who presents for an Initial Medicare Annual Wellness Visit.  Visit Complete: In person  Patient Medicare AWV questionnaire was completed by the patient on 10/16/2023; I have confirmed that all information answered by patient is correct and no changes since this date.  Cardiac Risk Factors include: advanced age (>45men, >5 women);hypertension     Objective:    Today's Vitals   10/20/23 1002 10/20/23 1029  BP: (!) 152/78 (!) 152/78  Pulse: 68   SpO2: 98%   Weight: 171 lb 3.2 oz (77.7 kg)   Height: 5\' 6"  (1.676 m)    Body mass index is 27.63 kg/m.     10/20/2023   10:00 AM 10/21/2018   12:26 PM  Advanced Directives  Does Patient Have a Medical Advance Directive? No No  Would patient like information on creating a medical advance directive? Yes (MAU/Ambulatory/Procedural Areas - Information given) No - Patient declined    Current Medications (verified) Outpatient Encounter Medications as of 10/20/2023  Medication Sig   FLUZONE HIGH-DOSE 0.5 ML injection Inject 0.5 mLs into the muscle once.   Probiotic Product (ALIGN) 4 MG CAPS Take 4 mg by mouth 1 day or 1 dose. Takes 1 tablet every other day   LACTASE ENZYME PO Take 5 mg by mouth daily.    lisinopril (ZESTRIL) 5 MG tablet Take 1 tablet (5 mg total) by mouth daily.   Multiple Vitamin (MULTIVITAMIN) tablet Take 1 tablet by mouth daily. Take 1/2 tablet daily   pantoprazole (PROTONIX) 40 MG tablet Take 1 tablet (40 mg total) by mouth daily.   [DISCONTINUED] lisinopril (ZESTRIL) 5 MG tablet Take 1 tablet (5 mg total) by mouth daily.   [DISCONTINUED] pantoprazole (PROTONIX) 40 MG tablet Take 1 tablet (40 mg total) by mouth daily.   No facility-administered encounter medications on file as of 10/20/2023.    Allergies (verified) Patient has no known allergies.   History: Past Medical History:  Diagnosis Date   Anemia    as infant-13 months of age   Arthritis    Blood  transfusion without reported diagnosis    at 18 months   GERD (gastroesophageal reflux disease)    Hypertension    Raynaud's syndrome    2005 or 2006   Thyroid disease    Past Surgical History:  Procedure Laterality Date   ORIF WRIST FRACTURE Right 10/21/2018   Procedure: OPEN REDUCTION INTERNAL FIXATION (ORIF) RIGHT OPEN DISTAL RADIUS/ULNA FRACTURE FRACTURE;  Surgeon: Sheral Apley, MD;  Location: [facility];  Service: Orthopedics;  Laterality: Right;   TONSILLECTOMY     age 29   WISDOM TOOTH EXTRACTION     Family History  Problem Relation Age of Onset   Parkinson's disease Mother    Colon cancer Neg Hx    Colon polyps Neg Hx    Esophageal cancer Neg Hx    Rectal cancer Neg Hx    Stomach cancer Neg Hx    Social History   Socioeconomic History   Marital status: Widowed    Spouse name: Not on file   Number of children: Not on file   Years of education: Not on file   Highest education level: Master's degree (e.g., MA, MS, MEng, MEd, MSW, MBA)  Occupational History   Not on file  Tobacco Use   Smoking status: Never    Passive exposure: Past (as a kid)   Smokeless tobacco: Never  Vaping Use   Vaping status:  Never Used  Substance and Sexual Activity   Alcohol use: Never   Drug use: Never   Sexual activity: Not on file  Other Topics Concern   Not on file  Social History Narrative   Not on file   Social Drivers of Health   Financial Resource Strain: Low Risk  (10/20/2023)   Overall Financial Resource Strain (CARDIA)    Difficulty of Paying Living Expenses: Not hard at all  Food Insecurity: No Food Insecurity (10/20/2023)   Hunger Vital Sign    Worried About Running Out of Food in the Last Year: Never true    Ran Out of Food in the Last Year: Never true  Transportation Needs: No Transportation Needs (10/20/2023)   PRAPARE - Administrator, Civil Service (Medical): No    Lack of Transportation (Non-Medical): No  Physical Activity: Sufficiently Active  (10/20/2023)   Exercise Vital Sign    Days of Exercise per Week: 7 days    Minutes of Exercise per Session: 120 min  Stress: No Stress Concern Present (10/20/2023)   [employer] of Occupational Health - Occupational Stress Questionnaire    Feeling of Stress : Not at all  Social Connections: Socially Isolated (10/20/2023)   Social Connection and Isolation Panel [NHANES]    Frequency of Communication with Friends and Family: More than three times a week    Frequency of Social Gatherings with Friends and Family: Once a week    Attends Religious Services: Never    Database administrator or Organizations: No    Attends Banker Meetings: Never    Marital Status: Widowed    Tobacco Counseling Counseling given: Not Answered   Clinical Intake:     Pain : No/denies pain     BMI - recorded: 27.63 Nutritional Status: BMI 25 -29 Overweight Nutritional Risks: None Diabetes: No  How often do you need to have someone help you when you read instructions, pamphlets, or other written materials from your doctor or pharmacy?: 1 - Never  Interpreter Needed?: No  Information entered by :: Hassell Halim, CMA   Activities of Daily Living    10/20/2023   10:06 AM 10/16/2023    9:02 AM  In your present state of health, do you have any difficulty performing the following activities:  Hearing? 0 0  Vision? 0 0  Difficulty concentrating or making decisions? 0 0  Walking or climbing stairs? 0 0  Dressing or bathing? 0 0  Doing errands, shopping? 0 0  Preparing Food and eating ? N N  Using the Toilet? N N  In the past six months, have you accidently leaked urine? N N  Do you have problems with loss of bowel control? N N  Managing your Medications? N N  Managing your Finances? N N  Housekeeping or managing your Housekeeping? N N    Patient Care Team: Myrlene Broker, MD as PCP - General (Internal Medicine)  Indicate any recent Medical Services you may have received  from other than [facility] providers in the past year (date may be approximate).     Assessment:   This is a routine wellness examination for Kerri Allen.  Hearing/Vision screen Hearing Screening - Comments:: Denies hearing difficulties   Vision Screening - Comments:: Wears eyeglasses for reading only   Goals Addressed               This Visit's Progress     Weight (lb) < 200 lb (90.7 kg) (pt-stated)  171 lb 3.2 oz (77.7 kg)     Patient stated she plans to stay active and lose weight.       Depression Screen    10/20/2023   10:10 AM 10/18/2022    9:07 AM 10/18/2022    9:06 AM 11/12/2021    1:41 PM 09/24/2021    8:39 AM 07/26/2019    8:17 AM 07/23/2018    8:03 AM  PHQ 2/9 Scores  PHQ - 2 Score 0 0 0 0 0 0 0  PHQ- 9 Score  0         Fall Risk    10/20/2023   10:07 AM 10/16/2023    9:02 AM 10/18/2022    9:06 AM 11/12/2021    1:41 PM  Fall Risk   Falls in the past year? 0 0 0 1  Number falls in past yr: 0 0 0 1  Injury with Fall? 0 0 0 0  Risk for fall due to : No Fall Risks     Follow up Falls evaluation completed;Falls prevention discussed  Falls evaluation completed     MEDICARE RISK AT HOME: Medicare Risk at Home Any stairs in or around the home?: Yes If so, are there any without handrails?: No Home free of loose throw rugs in walkways, pet beds, electrical cords, etc?: Yes Adequate lighting in your home to reduce risk of falls?: Yes Life alert?: No Use of a cane, walker or w/c?: No Grab bars in the bathroom?: Yes Shower chair or bench in shower?: Yes Elevated toilet seat or a handicapped toilet?: No  TIMED UP AND GO:  Was the test performed? No    Cognitive Function:        10/20/2023   10:07 AM  6CIT Screen  What Year? 0 points  What month? 0 points  What time? 0 points  Count back from 20 0 points  Months in reverse 0 points  Repeat phrase 0 points  Total Score 0 points    Immunizations Immunization History  Administered Date(s) Administered    Influenza Whole 06/11/2014   Influenza,inj,Quad PF,6+ Mos 06/26/2015, 07/20/2016, 07/21/2017, 07/23/2018, 07/26/2019, 07/10/2020   Influenza-Unspecified 06/05/2021, 06/09/2022   PFIZER(Purple Top)SARS-COV-2 Vaccination 11/22/2019, 12/13/2019, 07/24/2020   PNEUMOCOCCAL CONJUGATE-20 10/18/2022   Tdap 10/21/2018   Unspecified SARS-COV-2 Vaccination 06/09/2022   Zoster Recombinant(Shingrix) 11/13/2017, 01/19/2018   Zoster, Live 07/21/2012    TDAP status: Up to date - 10/21/2018  Flu Vaccine status: Due, Education has been provided regarding the importance of this vaccine. Advised may receive this vaccine at local pharmacy or Health Dept. Aware to provide a copy of the vaccination record if obtained from local pharmacy or Health Dept. Verbalized acceptance and understanding.  Pneumococcal vaccine status: Up to date - 10/18/2022  Covid-19 vaccine status: Information provided on how to obtain vaccines.   Qualifies for Shingles Vaccine? Yes   Zostavax completed Yes   Shingrix Completed?: Yes  Screening Tests Health Maintenance  Topic Date Due   Colonoscopy  09/05/2018   MAMMOGRAM  08/05/2021   INFLUENZA VACCINE  04/06/2023   COVID-19 Vaccine (5 - 2024-25 season) 05/07/2023   Medicare Annual Wellness (AWV)  10/19/2024   DTaP/Tdap/Td (2 - Td or Tdap) 10/21/2028   Pneumonia Vaccine 30+ Years old  Completed   DEXA SCAN  Completed   Hepatitis C Screening  Completed   Zoster Vaccines- Shingrix  Completed   HPV VACCINES  Aged Out    Health Maintenance  Health Maintenance Due  Topic Date  Due   Colonoscopy  09/05/2018   MAMMOGRAM  08/05/2021   INFLUENZA VACCINE  04/06/2023   COVID-19 Vaccine (5 - 2024-25 season) 05/07/2023    Colorectal cancer screening: Type of screening: Colonoscopy. Completed 09/05/2013. Repeat every 10 years. Pt declined at this time to have a repeat procedure.  Mammogram status: Completed 08/08/2022. Repeat every year - pt plans to have Mammogram in 11/2023 [facility]).  Bone Density status: Completed 07/26/2019. Results reflect: Bone density results: OSTEOPENIA. Repeat every 3-5 years.  Pt declines to repeat DEXA scan.   Additional Screening:  Hepatitis C Screening: does qualify; Completed 07/20/2016  Vision Screening: Recommended annual ophthalmology exams for early detection of glaucoma and other disorders of the eye. Is the patient up to date with their annual eye exam?  Yes  Who is the provider or what is the name of the office in which the patient attends annual eye exams? Pt uses eyeglasses for reading only. Seen Ophthalm[facility] at [facility], but pt has declined to have a repeat eye exam for now. If pt is not established with a provider, would they like to be referred to a provider to establish care? No .   Dental Screening: Recommended annual dental exams for proper oral hygiene  Community Resource Referral / Chronic Care Management: CRR required this visit?  No   CCM required this visit?  No     Plan:     I have personally reviewed and noted the following in the patient's chart:   Medical and social history Use of alcohol, tobacco or illicit drugs  Current medications and supplements including opioid prescriptions. Patient is not currently taking opioid prescriptions. Functional ability and status Nutritional status Physical activity Advanced directives List of other physicians Hospitalizations, surgeries, and ER visits in previous 12 months Vitals Screenings to include cognitive, depression, and falls Referrals and appointments  In addition, I have reviewed and discussed with patient certain preventive protocols, quality metrics, and best practice recommendations. A written personalized care plan for preventive services as well as general preventive health recommendations were provided to patient.     Darreld Mclean, CMA   10/20/2023   After Visit Summary: (MyChart) Due to this being a telephonic visit, the  after visit summary with patients personalized plan was offered to patient via MyChart   Nurse Notes: Recommended the [facility] unit for a repeat Mammogram in 11/2023.

## 2023-10-20 NOTE — Patient Instructions (Addendum)
Ms. Vanstone , Thank you for taking time to come for your Medicare Wellness Visit. I appreciate your ongoing commitment to your health goals. Please review the following plan we discussed and let me know if I can assist you in the future.   Referrals/Orders/Follow-Ups/Clinician Recommendations: Aim for 30 minutes of exercise or brisk walking, 6-8 glasses of water, and 5 servings of fruits and vegetables each day. Appt w/PCP on this Monday.    This is a list of the screening recommended for you and due dates:  Health Maintenance  Topic Date Due   Colon Cancer Screening  09/05/2018   Mammogram  08/05/2021   Flu Shot  04/06/2023   COVID-19 Vaccine (5 - 2024-25 season) 05/07/2023   Medicare Annual Wellness Visit  10/19/2024   DTaP/Tdap/Td vaccine (2 - Td or Tdap) 10/21/2028   Pneumonia Vaccine  Completed   DEXA scan (bone density measurement)  Completed   Hepatitis C Screening  Completed   Zoster (Shingles) Vaccine  Completed   HPV Vaccine  Aged Out    Advanced directives: (Copy Requested) Please bring a copy of your health care power of attorney and living will to the office to be added to your chart at your convenience.  Next Medicare Annual Wellness Visit scheduled for next year: Yes - 10/2024.

## 2023-10-23 ENCOUNTER — Encounter: Payer: Self-pay | Admitting: Internal Medicine

## 2023-10-23 ENCOUNTER — Ambulatory Visit (INDEPENDENT_AMBULATORY_CARE_PROVIDER_SITE_OTHER): Payer: Medicare Other | Admitting: Internal Medicine

## 2023-10-23 VITALS — BP 156/100 | HR 90 | Temp 98.2°F | Ht 66.0 in | Wt 173.0 lb

## 2023-10-23 DIAGNOSIS — E785 Hyperlipidemia, unspecified: Secondary | ICD-10-CM | POA: Insufficient documentation

## 2023-10-23 DIAGNOSIS — E782 Mixed hyperlipidemia: Secondary | ICD-10-CM | POA: Diagnosis not present

## 2023-10-23 DIAGNOSIS — I10 Essential (primary) hypertension: Secondary | ICD-10-CM | POA: Diagnosis not present

## 2023-10-23 DIAGNOSIS — K219 Gastro-esophageal reflux disease without esophagitis: Secondary | ICD-10-CM | POA: Diagnosis not present

## 2023-10-23 DIAGNOSIS — Z0000 Encounter for general adult medical examination without abnormal findings: Secondary | ICD-10-CM

## 2023-10-23 MED ORDER — PANTOPRAZOLE SODIUM 40 MG PO TBEC: 40.0000 mg | DELAYED_RELEASE_TABLET | Freq: Every day | ORAL | 3 refills | Status: DC

## 2023-10-23 MED ORDER — LISINOPRIL 10 MG PO TABS: 10.0000 mg | ORAL_TABLET | Freq: Every day | ORAL | 3 refills | Status: DC

## 2023-10-23 NOTE — Progress Notes (Signed)
   Subjective:   Patient ID: Elesa Allen, female    DOB: 12/19/1955, 68 y.o.   MRN: [id number]  HPI The patient is here for physical. Having new concerns about high BP and high cholesterol.   PMH, [facility], social history reviewed and updated  Review of Systems  Constitutional: Negative.   HENT: Negative.    Eyes: Negative.   Respiratory:  Negative for cough, chest tightness and shortness of breath.   Cardiovascular:  Negative for chest pain, palpitations and leg swelling.  Gastrointestinal:  Negative for abdominal distention, abdominal pain, constipation, diarrhea, nausea and vomiting.  Musculoskeletal: Negative.   Skin: Negative.   Neurological: Negative.   Psychiatric/Behavioral: Negative.      Objective:  Physical Exam Constitutional:      Appearance: She is well-developed.  HENT:     Head: Normocephalic and atraumatic.  Cardiovascular:     Rate and Rhythm: Normal rate and regular rhythm.  Pulmonary:     Effort: Pulmonary effort is normal. No respiratory distress.     Breath sounds: Normal breath sounds. No wheezing or rales.  Abdominal:     General: Bowel sounds are normal. There is no distension.     Palpations: Abdomen is soft.     Tenderness: There is no abdominal tenderness. There is no rebound.  Musculoskeletal:     Cervical back: Normal range of motion.  Skin:    General: Skin is warm and dry.  Neurological:     Mental Status: She is alert and oriented to person, place, and time.     Coordination: Coordination normal.     Vitals:   10/23/23 0912 10/23/23 0915  BP: (!) 156/100 (!) 156/100  Pulse: 90   Temp: 98.2 F (36.8 C)   TempSrc: Oral   SpO2: 99%   Weight: 173 lb (78.5 kg)   Height: 5\' 6"  (1.676 m)     Assessment & Plan:

## 2023-10-23 NOTE — Assessment & Plan Note (Signed)
BP is elevated and no symptoms today. Increase lisinopril to 10 mg daily and needs follow up 1 month on BP.

## 2023-10-23 NOTE — Assessment & Plan Note (Signed)
We discussed slight improvement in lipid panel from last year to this year. She is up some on weight and encouraged to get more activity and work on diet.

## 2023-10-23 NOTE — Assessment & Plan Note (Signed)
Flu shot up to date. Pneumonia complete. Shingrix complete. Tetanus up to date. Colonoscopy counseled. Mammogram up to date, pap smear up to date and dexa up to date. Counseled about sun safety and mole surveillance. Counseled about the dangers of distracted driving. Given 10 year screening recommendations.

## 2023-10-23 NOTE — Assessment & Plan Note (Signed)
Taking protonix 40 mg daily and recent flare of GERD she uses pepcid to help. Refilled today and will continue.

## 2023-10-23 NOTE — Patient Instructions (Signed)
We will increase the lisinopril to 10 mg daily.

## 2023-11-17 ENCOUNTER — Encounter: Payer: Self-pay | Admitting: Internal Medicine

## 2023-11-22 ENCOUNTER — Ambulatory Visit
Admission: RE | Admit: 2023-11-22 | Discharge: 2023-11-22 | Disposition: A | Discharge status: Home / Self Care | Source: Ambulatory Visit | Attending: Internal Medicine | Admitting: Internal Medicine

## 2023-11-22 ENCOUNTER — Other Ambulatory Visit: Payer: Self-pay | Admitting: Internal Medicine

## 2023-11-22 DIAGNOSIS — Z1231 Encounter for screening mammogram for malignant neoplasm of breast: Secondary | ICD-10-CM

## 2023-11-23 ENCOUNTER — Telehealth: Payer: Self-pay | Admitting: Internal Medicine

## 2023-11-23 NOTE — Telephone Encounter (Unsigned)
 Copied from CRM [id number]. Topic: Clinical - Medication Question >> Nov 23, 2023 12:13 PM Kathryne Eriksson wrote: Reason for CRM:  Nexium >> Nov 23, 2023 12:14 PM Kathryne Eriksson wrote: Patient is calling in wanting to know rather or not Myrlene Broker, MD send her prescription over for Nexium?

## 2023-11-23 NOTE — Telephone Encounter (Signed)
 Please send in refill

## 2023-11-24 MED ORDER — ESOMEPRAZOLE MAGNESIUM 40 MG PO CPDR: 40.0000 mg | DELAYED_RELEASE_CAPSULE | Freq: Every day | ORAL | 3 refills | Status: DC

## 2023-11-24 NOTE — Telephone Encounter (Signed)
 Sent in

## 2023-11-27 ENCOUNTER — Encounter: Payer: Self-pay | Admitting: Internal Medicine

## 2024-01-08 ENCOUNTER — Ambulatory Visit: Admitting: Internal Medicine

## 2024-01-15 DIAGNOSIS — K080 Exfoliation of teeth due to systemic causes: Secondary | ICD-10-CM | POA: Diagnosis not present

## 2024-01-16 DIAGNOSIS — K080 Exfoliation of teeth due to systemic causes: Secondary | ICD-10-CM | POA: Diagnosis not present

## 2024-03-01 DIAGNOSIS — K080 Exfoliation of teeth due to systemic causes: Secondary | ICD-10-CM | POA: Diagnosis not present

## 2024-03-07 ENCOUNTER — Other Ambulatory Visit: Payer: Self-pay | Admitting: Internal Medicine

## 2024-07-18 DIAGNOSIS — K080 Exfoliation of teeth due to systemic causes: Secondary | ICD-10-CM | POA: Diagnosis not present

## 2024-09-02 ENCOUNTER — Ambulatory Visit (INDEPENDENT_AMBULATORY_CARE_PROVIDER_SITE_OTHER): Admitting: Internal Medicine

## 2024-09-02 VITALS — BP 126/80 | HR 78 | Temp 97.5°F | Ht 66.0 in | Wt 176.4 lb

## 2024-09-02 DIAGNOSIS — I10 Essential (primary) hypertension: Secondary | ICD-10-CM | POA: Diagnosis not present

## 2024-09-02 DIAGNOSIS — H93A9 Pulsatile tinnitus, unspecified ear: Secondary | ICD-10-CM

## 2024-09-02 MED ORDER — LISINOPRIL 10 MG PO TABS: 10.0000 mg | ORAL_TABLET | Freq: Every day | ORAL | 3 refills | Status: AC

## 2024-09-02 MED ORDER — PANTOPRAZOLE SODIUM 40 MG PO TBEC: 40.0000 mg | DELAYED_RELEASE_TABLET | Freq: Every day | ORAL | 3 refills | Status: AC

## 2024-09-02 NOTE — Patient Instructions (Signed)
 We will get you in with ENT to check the tinnitus.

## 2024-09-02 NOTE — Progress Notes (Unsigned)
" ° °  Subjective:   Patient ID: Kerri Allen, female    DOB: 07-15-1956, 68 y.o.   MRN: [id number]  Discussed the use of AI scribe software for clinical note transcription with the patient, who gave verbal consent to proceed.  History of Present Illness Kerri Allen is a 68 year old female who presents with pulsatile tinnitus.  She experiences a constant awareness of her heartbeat, described as 'weird, funky, pulsatile tinnitus,' which is more pronounced at night when lying down in a quiet environment. The tinnitus is bilateral and began suddenly about six weeks ago. It significantly disrupts her sleep, and attempts to block out the sound with music have been ineffective.  She initially suspected her high blood pressure as the cause, but her recent reading was 124/80 mmHg, which is better than previous readings. She is concerned about potential causes such as tumors, ear fluid, intracranial pressure, and vascular issues after researching possibilities.  She is very sensitive to loud noises and avoids environments with high noise levels. No recent sinus issues, colds, or flu, and she received a flu shot. The tinnitus is impacting her sleep, which she values highly, and she notes that the condition is 'driving me insane.'  Review of Systems  Constitutional: Negative.   HENT:  Positive for tinnitus.   Eyes: Negative.   Respiratory:  Negative for cough, chest tightness and shortness of breath.   Cardiovascular:  Negative for chest pain, palpitations and leg swelling.  Gastrointestinal:  Negative for abdominal distention, abdominal pain, constipation, diarrhea, nausea and vomiting.  Musculoskeletal: Negative.   Skin: Negative.   Neurological: Negative.   Psychiatric/Behavioral: Negative.      Objective:  Physical Exam Constitutional:      Appearance: She is well-developed.  HENT:     Head: Normocephalic and atraumatic.     Right Ear: Tympanic membrane and ear canal normal.  There is no impacted cerumen.     Left Ear: Tympanic membrane and ear canal normal. There is no impacted cerumen.  Cardiovascular:     Rate and Rhythm: Normal rate and regular rhythm.  Pulmonary:     Effort: Pulmonary effort is normal. No respiratory distress.     Breath sounds: Normal breath sounds. No wheezing or rales.  Abdominal:     General: Bowel sounds are normal. There is no distension.     Palpations: Abdomen is soft.     Tenderness: There is no abdominal tenderness.  Musculoskeletal:     Cervical back: Normal range of motion.  Skin:    General: Skin is warm and dry.  Neurological:     Mental Status: She is alert and oriented to person, place, and time.     Coordination: Coordination normal.     Vitals:   09/02/24 1550  BP: 126/80  Pulse: 78  Temp: (!) 97.5 F (36.4 C)  TempSrc: Oral  SpO2: 99%  Weight: 176 lb 6.4 oz (80 kg)  Height: 5' 6 (1.676 m)    Assessment and Plan Assessment & Plan Pulsatile tinnitus   She experiences bilateral pulsatile tinnitus with a sudden onset 6 weeks ago. The differential diagnosis includes vascular issues, intracranial pressure, or ENT-related causes. There is no ear infection, fluid, or cerumen impaction. Her blood pressure is controlled at 124/80 mmHg. She is referred to ENT for evaluation and a hearing test. ENT will determine the need for imaging, such as an MRI.   "

## 2024-09-04 ENCOUNTER — Encounter: Payer: Self-pay | Admitting: Internal Medicine

## 2024-09-04 DIAGNOSIS — H93A9 Pulsatile tinnitus, unspecified ear: Secondary | ICD-10-CM | POA: Insufficient documentation

## 2024-09-04 NOTE — Assessment & Plan Note (Signed)
 She experiences bilateral pulsatile tinnitus with a sudden onset 6 weeks ago. The differential diagnosis includes vascular issues, intracranial pressure, or ENT-related causes. There is no ear infection, fluid, or cerumen impaction. Her blood pressure is controlled at 124/80 mmHg. She is referred to ENT for evaluation and a hearing test. ENT will determine the need for imaging, such as an MRI.

## 2024-09-04 NOTE — Assessment & Plan Note (Signed)
 BP is normal today and refilled lisinopril  10 mg daily.

## 2024-09-09 ENCOUNTER — Encounter (INDEPENDENT_AMBULATORY_CARE_PROVIDER_SITE_OTHER): Payer: Self-pay | Admitting: Otolaryngology

## 2024-09-09 ENCOUNTER — Ambulatory Visit (INDEPENDENT_AMBULATORY_CARE_PROVIDER_SITE_OTHER): Admitting: Otolaryngology

## 2024-09-09 VITALS — BP 117/81 | HR 111 | Ht 66.0 in | Wt 175.0 lb

## 2024-09-09 DIAGNOSIS — H93A3 Pulsatile tinnitus, bilateral: Secondary | ICD-10-CM | POA: Diagnosis not present

## 2024-09-09 DIAGNOSIS — H93A9 Pulsatile tinnitus, unspecified ear: Secondary | ICD-10-CM

## 2024-09-09 NOTE — Progress Notes (Signed)
 Dear Dr. Rollene, Here is my assessment for our mutual patient, Kerri Allen. Thank you for allowing me the opportunity to care for your patient. Please do not hesitate to contact me should you have any other questions. Sincerely, Dr. Eldora Blanch  Otolaryngology Clinic Note Referring provider: Dr. Rollene HPI:  Kerri Allen is a 69 y.o. female kindly referred by Dr. Rollene for evaluation of pulsatile tinnitus  Initial visit (09/2024): Discussed the use of AI scribe software for clinical note transcription with the patient, who gave verbal consent to proceed.  History of Present Illness Kerri Allen is a 69 year old female with Raynaud's syndrome who presents with bilateral pulsatile tinnitus, more pronounced on the right.  Pulsatile tinnitus began about six weeks ago, described as hearing her heartbeat in both ears, louder on the right. It is constant, not affected by position or neck movement, and is most noticeable at night in quiet environments, significantly disturbing her sleep. She describes a rhythmic thump, thump, thump and has tried masking with earphones and soft classical music with limited relief.  She denies otalgia, otorrhea, hearing loss, aural fullness, prior ear infections, trauma, otologic surgery, vertigo, palpitations, or other neurological symptoms. She first noticed the pulsatile tinnitus after getting up at night to use the bathroom and returning to bed.   She denies other autoimmune disease. She has no personal history of hearing loss  No antecedent event. Both ears, worse in right ear Patient denies: ear pain, fullness, vertigo, drainage, tinnitus Patient additionally denies: deep pain in ear canal, eustachian tube symptoms such as popping, crackling, sensitive to pressure changes Patient also denies barotrauma, vestibular suppressant use, ototoxic medication use Prior ear surgery: no  ENT Surgery: no Personal or FHx of bleeding dz or  anesthesia difficulty: no  AP/AC: no  Tobacco: no  PMHx: HTN, GERD, Raynaud's  Independent Review of Additional Tests or Records:  Dr. Rollene (09/02/2024): pulsatile tinnitus, awareness of heartbeat; suddenly, disrupts her sleep. Tinnitus when lying down. Disrupts her sleep; Dx: Pulsatile tinnitus; Rx: ref to ENT CBC and CMP 10/20/2023: WBC 9.2, BUN/Cr 13/1.09  PMH/Meds/All/SocHx/FamHx/ROS:   Past Medical History:  Diagnosis Date   Anemia    as infant-60 months of age   Arthritis    Blood transfusion without reported diagnosis    at 18 months   GERD (gastroesophageal reflux disease)    Hypertension    Raynaud's syndrome    2005 or 2006   Thyroid  disease      Past Surgical History:  Procedure Laterality Date   ORIF WRIST FRACTURE Right 10/21/2018   Procedure: OPEN REDUCTION INTERNAL FIXATION (ORIF) RIGHT OPEN DISTAL RADIUS/ULNA FRACTURE FRACTURE;  Surgeon: Beverley Evalene BIRCH, MD;  Location: [facility] OR;  Service: Orthopedics;  Laterality: Right;   TONSILLECTOMY     age 47   WISDOM TOOTH EXTRACTION      Family History  Problem Relation Age of Onset   Parkinson's disease Mother    Colon cancer Neg Hx    Colon polyps Neg Hx    Esophageal cancer Neg Hx    Rectal cancer Neg Hx    Stomach cancer Neg Hx      Social Connections: Socially Isolated (09/01/2024)   Social Connection and Isolation Panel    Frequency of Communication with Friends and Family: More than three times a week    Frequency of Social Gatherings with Friends and Family: Once a week    Attends Religious Services: Never    Database Administrator or  Organizations: No    Attends Banker Meetings: Not on file    Marital Status: Widowed     Current Medications[1]   Physical Exam:   BP (!) 150/84 (BP Location: Right Arm, Patient Position: Sitting, Cuff Size: Large)   Pulse (!) 111   Ht 5' 6 (1.676 m)   Wt 175 lb (79.4 kg)   SpO2 97%   BMI 28.25 kg/m   Salient findings:  CN II-XII  intact Given history and complaints, ear microscopy was indicated and performed for evaluation with findings as below in physical exam section and in procedures; Bilateral EAC clear and TM intact with well pneumatized middle ear spaces Weber 512: mid Rinne 512: AC > BC b/l  No audible objective pulsatile tinnitus or carotid bruits audible; no change with head movement or neck pressure Anterior rhinoscopy: Septum intact; bilateral inferior turbinates without significant hypertrophy No lesions of oral cavity/oropharynx No obviously palpable neck masses/lymphadenopathy/thyromegaly No respiratory distress or stridor  Seprately Identifiable Procedures:  Prior to initiating any procedures, risks/benefits/alternatives were explained to the patient and verbal consent obtained. Procedure: Bilateral ear microscopy using microscope (CPT [id number]) Pre-procedure diagnosis: pulsatile tinnitus Post-procedure diagnosis: same Indication: see above; given patient's otologic complaints and history, for improved and comprehensive examination of external ear and tympanic membrane, bilateral otologic examination using microscope was performed. Prior to proceeding, verbal consent was obtained after discussion of R/B/A  Procedure: Patient was placed semi-recumbent. Both ear canals were examined using the microscope with findings above. Patient tolerated the procedure well.   Impression & Plans:  Kerri Allen is a 69 y.o. female with:  1. Pulsatile tinnitus    Bilateral pulsatile tinnitus; persistent, bothersome; we discussed DDX and options. Will get scans to r/o common etiologies and AVM and any stenosis US  Carotid CTA Head w/wo MRV  F/u in 2 months with audio  See below regarding exact medications prescribed this encounter including dosages and route: No orders of the defined types were placed in this encounter.     Thank you for allowing me the opportunity to care for your patient. Please do not  hesitate to contact me should you have any other questions.  Sincerely, Eldora Blanch, MD Otolaryngologist (ENT), [facility] Phone: [phone] Fax: [phone]  09/09/2024, 1:26 PM   MDM:  [id number] Complexity/Problems addressed: mod - new problem, unknown prognosis needing further testing Data complexity: mod - independent review of note, labs, ordering test - Morbidity: low currently - Prescription Drug prescribed or managed: n      [1]  Current Outpatient Medications:    esomeprazole  (NEXIUM ) 40 MG capsule, Take 1 capsule (40 mg total) by mouth daily., Disp: 90 capsule, Rfl: 3   LACTASE ENZYME PO, Take 5 mg by mouth daily. , Disp: , Rfl:    lisinopril  (ZESTRIL ) 10 MG tablet, Take 1 tablet (10 mg total) by mouth daily., Disp: 90 tablet, Rfl: 3   Multiple Vitamin (MULTIVITAMIN) tablet, Take 1 tablet by mouth daily. Take 1/2 tablet daily, Disp: , Rfl:    pantoprazole  (PROTONIX ) 40 MG tablet, Take 1 tablet (40 mg total) by mouth daily., Disp: 90 tablet, Rfl: 3

## 2024-09-09 NOTE — Patient Instructions (Signed)
 I have ordered an imaging study for you to complete prior to your next visit. Please call [facility] Scheduling at [phone] to schedule your imaging if you have not received a call within 24 hours. If you are unable to complete your imaging study prior to your next scheduled visit please call our office to let us  know.

## 2024-09-20 ENCOUNTER — Ambulatory Visit (HOSPITAL_BASED_OUTPATIENT_CLINIC_OR_DEPARTMENT_OTHER)
Admission: RE | Admit: 2024-09-20 | Discharge: 2024-09-20 | Disposition: A | Discharge status: Home / Self Care | Source: Ambulatory Visit | Attending: Otolaryngology | Admitting: Otolaryngology

## 2024-09-20 DIAGNOSIS — H93A9 Pulsatile tinnitus, unspecified ear: Secondary | ICD-10-CM | POA: Diagnosis present

## 2024-09-20 LAB — POCT I-STAT CREATININE: Creatinine, Ser: 1.2 mg/dL — ABNORMAL HIGH (ref 0.44–1.00)

## 2024-09-20 MED ORDER — IOHEXOL 350 MG/ML SOLN
75.0000 mL | Freq: Once | INTRAVENOUS | Status: AC | PRN
  Administered 2024-09-20: 75 mL via INTRAVENOUS

## 2024-09-20 MED ORDER — GADOBUTROL 1 MMOL/ML IV SOLN
8.0000 mL | Freq: Once | INTRAVENOUS | Status: AC | PRN
  Administered 2024-09-20: 8 mL via INTRAVENOUS
  Filled 2024-09-20: qty 8

## 2024-10-09 ENCOUNTER — Telehealth: Payer: Self-pay

## 2024-10-09 NOTE — Telephone Encounter (Signed)
 Copied from CRM [id number]. Topic: Clinical - Lab/Test Results >> Oct 09, 2024  2:07 PM Taleah C wrote: Reason for CRM: pt called and requested to speak with pcp about her recent imaging scans from her ENT doctor. She asked for dr. Rollene to review her results and give her a call to discuss what she should do. She mentioned that she is very concerned of her results. She sees Dr. Tobie at [facility]. Please call and advise.

## 2024-10-10 ENCOUNTER — Ambulatory Visit (INDEPENDENT_AMBULATORY_CARE_PROVIDER_SITE_OTHER): Admitting: Audiology

## 2024-10-10 ENCOUNTER — Ambulatory Visit (INDEPENDENT_AMBULATORY_CARE_PROVIDER_SITE_OTHER): Admitting: Otolaryngology

## 2024-10-10 ENCOUNTER — Encounter (INDEPENDENT_AMBULATORY_CARE_PROVIDER_SITE_OTHER): Payer: Self-pay | Admitting: Otolaryngology

## 2024-10-10 VITALS — BP 143/85 | HR 86 | Ht 66.0 in | Wt 175.0 lb

## 2024-10-10 DIAGNOSIS — H903 Sensorineural hearing loss, bilateral: Secondary | ICD-10-CM

## 2024-10-10 DIAGNOSIS — H93A9 Pulsatile tinnitus, unspecified ear: Secondary | ICD-10-CM

## 2024-10-10 DIAGNOSIS — I6523 Occlusion and stenosis of bilateral carotid arteries: Secondary | ICD-10-CM

## 2024-10-10 NOTE — Progress Notes (Signed)
" °  [address] [place], [place] [address] [phone]  Audiological Evaluation    Name: Kerri Allen     DOB:   [birth year]      MRN:   [id number]                                                                                     Service Date: 10/10/2024     Accompanied by: self     Patient comes today after Dr. Tobie, ENT sent a referral for a hearing evaluation due to concerns with tinnitus.   Symptoms Yes Details  Hearing loss  []  Not perceived  Tinnitus  [x]  Pulsatile tinnitus- both ears constantly- onset was two months ago   Ear pain/ infections/pressure  []    Balance problems  []    Noise exposure history  []    Previous ear surgeries  []    Family history of hearing loss  [x]  Mother reportedly due to ear infections as a child.  Amplification  []    Other  [x]  Reports to be bothered by loud sounds    Otoscopy: Right ear: clear external ear canal and notable landmarks visualized on the tympanic membrane. Left ear:  clear external ear canal and notable landmarks visualized on the tympanic membrane.  Tympanometry: Right ear: Type A - Normal external ear canal volume with normal middle ear pressure and normal tympanic membrane compliance. Findings are consistent with normal middle ear function. Left ear: Type A - Normal external ear canal volume with normal middle ear pressure and normal tympanic membrane compliance. Findings are consistent with normal middle ear function.   Hearing Evaluation The hearing test results were completed under headphones and results are deemed to be of good reliability. 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 Left ear-  Normal hearing from [phone] Hz, then mild  sensorineural hearing loss from 2000 Hz - 8000 Hz.  Speech Audiometry: Right ear- Speech Reception Threshold (SRT) was obtained at 20 dBHL. Left  ear-Speech Reception Threshold (SRT) was obtained at 15 dBHL.   Word Recognition Score Tested using NU-6 (recorded) Right ear: 100% was obtained at a presentation level of 60 dBHL with contralateral masking which is deemed as  excellent. Left ear: 100% was obtained at a presentation level of 60 dBHL with contralateral masking which is deemed as  excellent.   Impression: There is not a significant difference in pure-tone thresholds between ears., There is not a significant difference in the word recognition score in between ears.    Recommendations: Follow up with ENT as scheduled.  Return for a hearing evaluation in three years, before if concerns with hearing changes arise or per MD recommendation.  Consider a communication needs assessment for amplification after medical clearance is obtained, when patient is ready.   Dearius Hoffmann MARIE LEROUX-MARTINEZ, AUD  "

## 2024-10-10 NOTE — Progress Notes (Signed)
 Dear Dr. Rollene, Here is my assessment for our mutual patient, Kerri Allen. Thank you for allowing me the opportunity to care for your patient. Please do not hesitate to contact me should you have any other questions. Sincerely, Dr. Eldora Blanch  Otolaryngology Clinic Note Referring provider: Dr. Rollene HPI:  Kerri Allen is a 69 y.o. female kindly referred by Dr. Rollene for evaluation of pulsatile tinnitus  Initial visit (09/2024): Discussed the use of AI scribe software for clinical note transcription with the patient, who gave verbal consent to proceed.  History of Present Illness Kerri Allen is a 69 year old female with Raynaud's syndrome who presents with bilateral pulsatile tinnitus, more pronounced on the right.  Pulsatile tinnitus began about six weeks ago, described as hearing her heartbeat in both ears, louder on the right. It is constant, not affected by position or neck movement, and is most noticeable at night in quiet environments, significantly disturbing her sleep. She describes a rhythmic thump, thump, thump and has tried masking with earphones and soft classical music with limited relief.  She denies otalgia, otorrhea, hearing loss, aural fullness, prior ear infections, trauma, otologic surgery, vertigo, palpitations, or other neurological symptoms. She first noticed the pulsatile tinnitus after getting up at night to use the bathroom and returning to bed.   She denies other autoimmune disease. She has no personal history of hearing loss  No antecedent event. Both ears, worse in right ear Patient denies: ear pain, fullness, vertigo, drainage, tinnitus Patient additionally denies: deep pain in ear canal, eustachian tube symptoms such as popping, crackling, sensitive to pressure changes Patient also denies barotrauma, vestibular suppressant use, ototoxic medication use Prior ear surgery:  no  --------------------------------------------------------- 10/10/2024 Seen in follow up. We discussed the patient's imaging. She is understandably quite worried about her carotid findings on US . She reports continued but much improved b/l pulsatile tinnitus without any triggers. Unclear what has improved her symptoms. No ear or neurologic symptoms. No SSCD sx. No vertigo.  She also had an audiogram which we discussed today  ENT Surgery: no Personal or FHx of bleeding dz or anesthesia difficulty: no  AP/AC: no  Tobacco: no  PMHx: HTN, GERD, Raynaud's  Independent Review of Additional Tests or Records:  Dr. Rollene (09/02/2024): pulsatile tinnitus, awareness of heartbeat; suddenly, disrupts her sleep. Tinnitus when lying down. Disrupts her sleep; Dx: Pulsatile tinnitus; Rx: ref to ENT CBC and CMP 10/20/2023: WBC 9.2, BUN/Cr 13/1.09 Audio 10/10/2024 reviewed -- normal downsloping to mild SNHL AU; A/A tymps; WRT 100% at 60dB AU  US  Carotid, CTA, MR/MRV reviewed with respect to ears: mastoids and ME well aerated, unable to appreciate any obvious otologic cause of pulsatile tinnitus; no noted retrocochlear lesions    PMH/Meds/All/SocHx/FamHx/ROS:   Past Medical History:  Diagnosis Date   Anemia    as infant-29 months of age   Arthritis    Blood transfusion without reported diagnosis    at 18 months   GERD (gastroesophageal reflux disease)    Hypertension    Raynaud's syndrome    2005 or 2006   Thyroid  disease      Past Surgical History:  Procedure Laterality Date   ORIF WRIST FRACTURE Right 10/21/2018   Procedure: OPEN REDUCTION INTERNAL FIXATION (ORIF) RIGHT OPEN DISTAL RADIUS/ULNA FRACTURE FRACTURE;  Surgeon: Beverley Evalene BIRCH, MD;  Location: [facility] OR;  Service: Orthopedics;  Laterality: Right;   TONSILLECTOMY     age 34   WISDOM TOOTH EXTRACTION      Family  History  Problem Relation Age of Onset   Parkinson's disease Mother    Colon cancer Neg Hx    Colon polyps Neg Hx     Esophageal cancer Neg Hx    Rectal cancer Neg Hx    Stomach cancer Neg Hx      Social Connections: Socially Isolated (09/01/2024)   Social Connection and Isolation Panel    Frequency of Communication with Friends and Family: More than three times a week    Frequency of Social Gatherings with Friends and Family: Once a week    Attends Religious Services: Never    Database Administrator or Organizations: No    Attends Engineer, Structural: Not on file    Marital Status: Widowed     Current Medications[1]   Physical Exam:   BP (!) 143/85 (BP Location: Left Arm, Patient Position: Sitting, Cuff Size: Large)   Pulse 86   Ht 5' 6 (1.676 m)   Wt 175 lb (79.4 kg)   SpO2 99%   BMI 28.25 kg/m   Salient findings:  CN II-XII intact Bilateral EAC clear and TM intact with well pneumatized middle ear spaces Weber 512: mid Rinne 512: AC > BC b/l  No audible objective pulsatile tinnitus or carotid bruits audible; no change with head movement or neck pressure No respiratory distress or stridor  Seprately Identifiable Procedures:  Prior to initiating any procedures, risks/benefits/alternatives were explained to the patient and verbal consent obtained. None today   Impression & Plans:  Kerri Allen is a 69 y.o. female with:  1. Carotid stenosis, asymptomatic, bilateral   2. Sensorineural hearing loss, bilateral   3. Pulsatile tinnitus    Bilateral pulsatile tinnitus; persistent, but now improved; we discussed DDX and options. We discussed next steps including NSGY referral (for possible angio) v/s further testing for AI Dz amongst others (low yield currently). She owuld like to observe for now.  Discussed return precautions as well and opted for PRN f/u She is understandably worried re: her carotid artery stenosis; will refer her to vascular surgery for this. She does not appear to have any symptoms from this but defer to their judgment   See below regarding exact  medications prescribed this encounter including dosages and route: No orders of the defined types were placed in this encounter.     Thank you for allowing me the opportunity to care for your patient. Please do not hesitate to contact me should you have any other questions.  Sincerely, Eldora Blanch, MD Otolaryngologist (ENT), [facility] Phone: [phone] Fax: [phone]  10/10/2024, 9:32 PM   MDM:  I have personally spent 43 minutes involved in face-to-face and non-face-to-face activities for this patient on the day of the visit.  Professional time spent excludes any procedures performed but includes the following activities, in addition to those noted in the documentation: preparing to see the patient (review of outside documentation and results), performing a medically appropriate examination, counseling, documenting in the electronic health record, independently interpreting results for various imaging       [1]  Current Outpatient Medications:    esomeprazole  (NEXIUM ) 40 MG capsule, Take 1 capsule (40 mg total) by mouth daily., Disp: 90 capsule, Rfl: 3   ibuprofen (ADVIL) 800 MG tablet, Take 800 mg by mouth every 6 (six) hours as needed., Disp: , Rfl:    LACTASE ENZYME PO, Take 5 mg by mouth daily. , Disp: , Rfl:    lisinopril  (ZESTRIL ) 10 MG tablet,  Take 1 tablet (10 mg total) by mouth daily., Disp: 90 tablet, Rfl: 3   Multiple Vitamin (MULTIVITAMIN) tablet, Take 1 tablet by mouth daily. Take 1/2 tablet daily, Disp: , Rfl:    pantoprazole  (PROTONIX ) 40 MG tablet, Take 1 tablet (40 mg total) by mouth daily., Disp: 90 tablet, Rfl: 3

## 2024-10-21 ENCOUNTER — Ambulatory Visit: Payer: Medicare Other

## 2024-11-04 ENCOUNTER — Ambulatory Visit (INDEPENDENT_AMBULATORY_CARE_PROVIDER_SITE_OTHER): Admitting: Otolaryngology

## 2024-11-04 ENCOUNTER — Ambulatory Visit (INDEPENDENT_AMBULATORY_CARE_PROVIDER_SITE_OTHER): Admitting: Audiology
